# Patient Record
Sex: Female | Born: 1995 | Race: Black or African American | Hispanic: No | Marital: Single | Smoking: Current some day smoker
Health system: Southern US, Community
[De-identification: ages and names within clinical notes are randomized; demographics above are authoritative.]

## PROBLEM LIST (undated history)

## (undated) DIAGNOSIS — IMO0001 Reserved for inherently not codable concepts without codable children: Secondary | ICD-10-CM

## (undated) DIAGNOSIS — O039 Complete or unspecified spontaneous abortion without complication: Secondary | ICD-10-CM

## (undated) DIAGNOSIS — R87629 Unspecified abnormal cytological findings in specimens from vagina: Secondary | ICD-10-CM

## (undated) DIAGNOSIS — Z789 Other specified health status: Secondary | ICD-10-CM

## (undated) HISTORY — PX: NO PAST SURGERIES: SHX2092

## (undated) HISTORY — DX: Reserved for inherently not codable concepts without codable children: IMO0001

## (undated) HISTORY — DX: Other specified health status: Z78.9

## (undated) HISTORY — DX: Complete or unspecified spontaneous abortion without complication: O03.9

## (undated) HISTORY — DX: Unspecified abnormal cytological findings in specimens from vagina: R87.629

---
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
## 2013-01-07 ENCOUNTER — Ambulatory Visit (INDEPENDENT_AMBULATORY_CARE_PROVIDER_SITE_OTHER): Payer: Medicaid Other | Admitting: Women's Health

## 2013-01-07 ENCOUNTER — Encounter: Payer: Self-pay | Admitting: Women's Health

## 2013-01-07 VITALS — BP 110/60 | Wt 151.8 lb

## 2013-01-07 DIAGNOSIS — O9989 Other specified diseases and conditions complicating pregnancy, childbirth and the puerperium: Secondary | ICD-10-CM

## 2013-01-07 DIAGNOSIS — O99323 Drug use complicating pregnancy, third trimester: Secondary | ICD-10-CM

## 2013-01-07 DIAGNOSIS — Z331 Pregnant state, incidental: Secondary | ICD-10-CM

## 2013-01-07 DIAGNOSIS — Z3403 Encounter for supervision of normal first pregnancy, third trimester: Secondary | ICD-10-CM

## 2013-01-07 DIAGNOSIS — Z1389 Encounter for screening for other disorder: Secondary | ICD-10-CM

## 2013-01-07 DIAGNOSIS — O99320 Drug use complicating pregnancy, unspecified trimester: Secondary | ICD-10-CM | POA: Insufficient documentation

## 2013-01-07 NOTE — Progress Notes (Signed)
Pain in lower belly, worse at night.

## 2013-01-07 NOTE — Progress Notes (Signed)
Reports good fm. Denies regular/painful uc's, lof, vb, urinary frequency, urgency, hesitancy, or dysuria.  No complaints.  Reviewed labor s/s and fetal kick counts.  GBS GC/CT collected, UDS today. Reviewed contraception options, thinking about mirena- pamphlet given. All questions answered. F/U in 1 for visit.

## 2013-01-07 NOTE — Patient Instructions (Addendum)
Braxton Hicks Contractions  Pregnancy is commonly associated with contractions of the uterus throughout the pregnancy. Towards the end of pregnancy (32 to 34 weeks), these contractions (Braxton Hicks) can develop more often and may become more forceful. This is not true labor because these contractions do not result in opening (dilatation) and thinning of the cervix. They are sometimes difficult to tell apart from true labor because these contractions can be forceful and people have different pain tolerances. You should not feel embarrassed if you go to the hospital with false labor. Sometimes, the only way to tell if you are in true labor is for your caregiver to follow the changes in the cervix.  How to tell the difference between true and false labor:  · False labor.  · The contractions of false labor are usually shorter, irregular and not as hard as those of true labor.  · They are often felt in the front of the lower abdomen and in the groin.  · They may leave with walking around or changing positions while lying down.  · They get weaker and are shorter lasting as time goes on.  · These contractions are usually irregular.  · They do not usually become progressively stronger, regular and closer together as with true labor.  · True labor.  · Contractions in true labor last 30 to 70 seconds, become very regular, usually become more intense, and increase in frequency.  · They do not go away with walking.  · The discomfort is usually felt in the top of the uterus and spreads to the lower abdomen and low back.  · True labor can be determined by your caregiver with an exam. This will show that the cervix is dilating and getting thinner.  If there are no prenatal problems or other health problems associated with the pregnancy, it is completely safe to be sent home with false labor and await the onset of true labor.  HOME CARE INSTRUCTIONS   · Keep up with your usual exercises and instructions.  · Take medications as  directed.  · Keep your regular prenatal appointment.  · Eat and drink lightly if you think you are going into labor.  · If BH contractions are making you uncomfortable:  · Change your activity position from lying down or resting to walking/walking to resting.  · Sit and rest in a tub of warm water.  · Drink 2 to 3 glasses of water. Dehydration may cause B-H contractions.  · Do slow and deep breathing several times an hour.  SEEK IMMEDIATE MEDICAL CARE IF:   · Your contractions continue to become stronger, more regular, and closer together.  · You have a gushing, burst or leaking of fluid from the vagina.  · An oral temperature above 102° F (38.9° C) develops.  · You have passage of blood-tinged mucus.  · You develop vaginal bleeding.  · You develop continuous belly (abdominal) pain.  · You have low back pain that you never had before.  · You feel the baby's head pushing down causing pelvic pressure.  · The baby is not moving as much as it used to.  Document Released: 08/07/2005 Document Revised: 10/30/2011 Document Reviewed: 01/29/2009  ExitCare® Patient Information ©2013 [employer].

## 2013-01-08 LAB — DRUG SCREEN, URINE, NO CONFIRMATION
Benzodiazepines.: NEGATIVE
Creatinine,U: 146.9 mg/dL
Opiate Screen, Urine: NEGATIVE
Phencyclidine (PCP): NEGATIVE
Propoxyphene: NEGATIVE

## 2013-01-08 LAB — GC/CHLAMYDIA PROBE AMP
CT Probe RNA: NEGATIVE
GC Probe RNA: NEGATIVE

## 2013-01-08 LAB — OXYCODONE SCREEN, UA, RFLX CONFIRM: Oxycodone Screen, Ur: NEGATIVE ng/mL

## 2013-01-09 LAB — STREP B DNA PROBE: GBSP: POSITIVE

## 2013-01-14 ENCOUNTER — Encounter: Payer: Self-pay | Admitting: Women's Health

## 2013-01-17 ENCOUNTER — Ambulatory Visit (INDEPENDENT_AMBULATORY_CARE_PROVIDER_SITE_OTHER): Payer: Medicaid Other | Admitting: Obstetrics and Gynecology

## 2013-01-17 ENCOUNTER — Encounter: Payer: Self-pay | Admitting: Obstetrics and Gynecology

## 2013-01-17 VITALS — BP 120/80 | Wt 148.6 lb

## 2013-01-17 DIAGNOSIS — L309 Dermatitis, unspecified: Secondary | ICD-10-CM | POA: Insufficient documentation

## 2013-01-17 DIAGNOSIS — Z331 Pregnant state, incidental: Secondary | ICD-10-CM

## 2013-01-17 DIAGNOSIS — Z1389 Encounter for screening for other disorder: Secondary | ICD-10-CM

## 2013-01-17 DIAGNOSIS — Z3483 Encounter for supervision of other normal pregnancy, third trimester: Secondary | ICD-10-CM

## 2013-01-17 LAB — POCT URINALYSIS DIPSTICK
Glucose, UA: NEGATIVE
Nitrite, UA: NEGATIVE
Protein, UA: NEGATIVE

## 2013-01-17 NOTE — Progress Notes (Signed)
39 wk  Pt here today for routine visit. Pt states baby moving good, having some irregular contractions, and having some pressure. Denies any other problems at this time  + mild Headache , normal pressures, negative u/a  Reflexes 1+

## 2013-01-23 ENCOUNTER — Ambulatory Visit (INDEPENDENT_AMBULATORY_CARE_PROVIDER_SITE_OTHER): Payer: Medicaid Other | Admitting: Obstetrics and Gynecology

## 2013-01-23 VITALS — BP 130/78 | Wt 150.2 lb

## 2013-01-23 DIAGNOSIS — Z331 Pregnant state, incidental: Secondary | ICD-10-CM

## 2013-01-23 DIAGNOSIS — Z3493 Encounter for supervision of normal pregnancy, unspecified, third trimester: Secondary | ICD-10-CM

## 2013-01-23 DIAGNOSIS — O2390 Unspecified genitourinary tract infection in pregnancy, unspecified trimester: Secondary | ICD-10-CM

## 2013-01-23 DIAGNOSIS — Z1389 Encounter for screening for other disorder: Secondary | ICD-10-CM

## 2013-01-23 LAB — POCT URINALYSIS DIPSTICK

## 2013-01-23 NOTE — Progress Notes (Signed)
S:states have regular contractions since yesterday with mucous discharge, white in color, -bleeding, -srom. Good FM,    P:  Will schedule IOL at 41 wk, 01/31/13. jvf

## 2013-01-26 ENCOUNTER — Encounter (HOSPITAL_COMMUNITY): Payer: Self-pay | Admitting: *Deleted

## 2013-01-26 ENCOUNTER — Inpatient Hospital Stay (HOSPITAL_COMMUNITY)
Admission: EM | Admit: 2013-01-26 | Discharge: 2013-01-28 | DRG: 774 | Disposition: A | Discharge status: Home / Self Care | Payer: Medicaid Other | Attending: Obstetrics & Gynecology | Admitting: Obstetrics & Gynecology

## 2013-01-26 ENCOUNTER — Encounter (HOSPITAL_COMMUNITY): Payer: Self-pay | Admitting: Anesthesiology

## 2013-01-26 ENCOUNTER — Inpatient Hospital Stay (HOSPITAL_COMMUNITY): Payer: Medicaid Other | Admitting: Anesthesiology

## 2013-01-26 DIAGNOSIS — Z22330 Carrier of Group B streptococcus: Secondary | ICD-10-CM

## 2013-01-26 DIAGNOSIS — O9989 Other specified diseases and conditions complicating pregnancy, childbirth and the puerperium: Secondary | ICD-10-CM

## 2013-01-26 DIAGNOSIS — Z3490 Encounter for supervision of normal pregnancy, unspecified, unspecified trimester: Secondary | ICD-10-CM

## 2013-01-26 DIAGNOSIS — O99892 Other specified diseases and conditions complicating childbirth: Secondary | ICD-10-CM | POA: Diagnosis present

## 2013-01-26 DIAGNOSIS — Z3403 Encounter for supervision of normal first pregnancy, third trimester: Secondary | ICD-10-CM

## 2013-01-26 DIAGNOSIS — R109 Unspecified abdominal pain: Secondary | ICD-10-CM

## 2013-01-26 DIAGNOSIS — O99323 Drug use complicating pregnancy, third trimester: Secondary | ICD-10-CM

## 2013-01-26 LAB — CBC
Hemoglobin: 11.1 g/dL — ABNORMAL LOW (ref 12.0–15.0)
MCH: 30.7 pg (ref 26.0–34.0)
MCV: 84.5 fL (ref 78.0–100.0)
RBC: 3.61 MIL/uL — ABNORMAL LOW (ref 3.87–5.11)

## 2013-01-26 LAB — RPR: RPR Ser Ql: NONREACTIVE

## 2013-01-26 MED ORDER — OXYTOCIN 40 UNITS IN LACTATED RINGERS INFUSION - SIMPLE MED
1.0000 m[IU]/min | INTRAVENOUS | Status: DC
  Administered 2013-01-26: 1 m[IU]/min via INTRAVENOUS
  Filled 2013-01-26: qty 1000

## 2013-01-26 MED ORDER — SODIUM CHLORIDE 0.9 % IV SOLN: 1.0000 g | Freq: Four times a day (QID) | INTRAVENOUS | Status: DC

## 2013-01-26 MED ORDER — ONDANSETRON HCL 4 MG PO TABS: 4.0000 mg | ORAL_TABLET | ORAL | Status: DC | PRN

## 2013-01-26 MED ORDER — LACTATED RINGERS IV SOLN
500.0000 mL | Freq: Once | INTRAVENOUS | Status: AC
  Administered 2013-01-26: 500 mL via INTRAVENOUS

## 2013-01-26 MED ORDER — DEXTROSE 5 % IV SOLN
2.5000 10*6.[IU] | INTRAVENOUS | Status: DC
  Administered 2013-01-26 (×2): 2.5 10*6.[IU] via INTRAVENOUS
  Filled 2013-01-26 (×5): qty 2.5

## 2013-01-26 MED ORDER — FENTANYL 2.5 MCG/ML BUPIVACAINE 1/10 % EPIDURAL INFUSION (WH - ANES)
INTRAMUSCULAR | Status: DC | PRN
  Administered 2013-01-26: 14 mL/h via EPIDURAL

## 2013-01-26 MED ORDER — GENTAMICIN SULFATE 40 MG/ML IJ SOLN
140.0000 mg | Freq: Three times a day (TID) | INTRAMUSCULAR | Status: DC
  Filled 2013-01-26: qty 3.5

## 2013-01-26 MED ORDER — PRENATAL MULTIVITAMIN CH
1.0000 | ORAL_TABLET | Freq: Every day | ORAL | Status: DC
  Administered 2013-01-27 – 2013-01-28 (×2): 1 via ORAL
  Filled 2013-01-26 (×2): qty 1

## 2013-01-26 MED ORDER — ONDANSETRON HCL 4 MG/2ML IJ SOLN: 4.0000 mg | Freq: Four times a day (QID) | INTRAMUSCULAR | Status: DC | PRN

## 2013-01-26 MED ORDER — OXYCODONE-ACETAMINOPHEN 5-325 MG PO TABS: 1.0000 | ORAL_TABLET | ORAL | Status: DC | PRN

## 2013-01-26 MED ORDER — EPHEDRINE 5 MG/ML INJ
10.0000 mg | INTRAVENOUS | Status: DC | PRN
  Filled 2013-01-26: qty 2

## 2013-01-26 MED ORDER — SODIUM CHLORIDE 0.9 % IV SOLN
2.0000 g | Freq: Four times a day (QID) | INTRAVENOUS | Status: DC
  Administered 2013-01-26: 2 g via INTRAVENOUS
  Filled 2013-01-26 (×2): qty 2000

## 2013-01-26 MED ORDER — LIDOCAINE HCL (PF) 1 % IJ SOLN
30.0000 mL | INTRAMUSCULAR | Status: DC | PRN
  Filled 2013-01-26 (×2): qty 30

## 2013-01-26 MED ORDER — OXYTOCIN BOLUS FROM INFUSION: 500.0000 mL | INTRAVENOUS | Status: DC

## 2013-01-26 MED ORDER — ONDANSETRON HCL 4 MG/2ML IJ SOLN: 4.0000 mg | INTRAMUSCULAR | Status: DC | PRN

## 2013-01-26 MED ORDER — PHENYLEPHRINE 40 MCG/ML (10ML) SYRINGE FOR IV PUSH (FOR BLOOD PRESSURE SUPPORT)
PREFILLED_SYRINGE | INTRAVENOUS | Status: AC
  Filled 2013-01-26: qty 5

## 2013-01-26 MED ORDER — FENTANYL 2.5 MCG/ML BUPIVACAINE 1/10 % EPIDURAL INFUSION (WH - ANES)
INTRAMUSCULAR | Status: AC
  Filled 2013-01-26: qty 125

## 2013-01-26 MED ORDER — PENICILLIN G POTASSIUM 5000000 UNITS IJ SOLR
5.0000 10*6.[IU] | Freq: Once | INTRAMUSCULAR | Status: AC
  Administered 2013-01-26: 5 10*6.[IU] via INTRAVENOUS
  Filled 2013-01-26: qty 5

## 2013-01-26 MED ORDER — IBUPROFEN 600 MG PO TABS: 600.0000 mg | ORAL_TABLET | Freq: Four times a day (QID) | ORAL | Status: DC | PRN

## 2013-01-26 MED ORDER — ZOLPIDEM TARTRATE 5 MG PO TABS: 5.0000 mg | ORAL_TABLET | Freq: Every evening | ORAL | Status: DC | PRN

## 2013-01-26 MED ORDER — IBUPROFEN 600 MG PO TABS
600.0000 mg | ORAL_TABLET | Freq: Four times a day (QID) | ORAL | Status: DC
  Administered 2013-01-26 – 2013-01-28 (×7): 600 mg via ORAL
  Filled 2013-01-26 (×7): qty 1

## 2013-01-26 MED ORDER — TERBUTALINE SULFATE 1 MG/ML IJ SOLN
0.2500 mg | Freq: Once | INTRAMUSCULAR | Status: AC
  Administered 2013-01-26: 0.25 mg via SUBCUTANEOUS
  Filled 2013-01-26: qty 1

## 2013-01-26 MED ORDER — SIMETHICONE 80 MG PO CHEW: 80.0000 mg | CHEWABLE_TABLET | ORAL | Status: DC | PRN

## 2013-01-26 MED ORDER — FENTANYL CITRATE 0.05 MG/ML IJ SOLN
INTRAMUSCULAR | Status: AC
  Filled 2013-01-26: qty 2

## 2013-01-26 MED ORDER — SODIUM CHLORIDE 0.9 % IV BOLUS (SEPSIS)
500.0000 mL | Freq: Once | INTRAVENOUS | Status: AC
  Administered 2013-01-26: 500 mL via INTRAVENOUS

## 2013-01-26 MED ORDER — ACETAMINOPHEN 325 MG PO TABS: 650.0000 mg | ORAL_TABLET | ORAL | Status: DC | PRN

## 2013-01-26 MED ORDER — FENTANYL CITRATE 0.05 MG/ML IJ SOLN: 100.0000 ug | INTRAMUSCULAR | Status: DC | PRN

## 2013-01-26 MED ORDER — LACTATED RINGERS IV SOLN
INTRAVENOUS | Status: DC
  Administered 2013-01-26: 07:00:00 via INTRAVENOUS

## 2013-01-26 MED ORDER — TETANUS-DIPHTH-ACELL PERTUSSIS 5-2.5-18.5 LF-MCG/0.5 IM SUSP: 0.5000 mL | Freq: Once | INTRAMUSCULAR | Status: DC

## 2013-01-26 MED ORDER — DIBUCAINE 1 % RE OINT: 1.0000 "application " | TOPICAL_OINTMENT | RECTAL | Status: DC | PRN

## 2013-01-26 MED ORDER — SENNOSIDES-DOCUSATE SODIUM 8.6-50 MG PO TABS
2.0000 | ORAL_TABLET | Freq: Every day | ORAL | Status: DC
Start: 2013-01-26 — End: 2013-01-28
  Administered 2013-01-27 (×2): 2 via ORAL

## 2013-01-26 MED ORDER — BENZOCAINE-MENTHOL 20-0.5 % EX AERO
1.0000 "application " | INHALATION_SPRAY | CUTANEOUS | Status: DC | PRN
  Administered 2013-01-26: 1 via TOPICAL
  Filled 2013-01-26: qty 56

## 2013-01-26 MED ORDER — LIDOCAINE HCL (PF) 1 % IJ SOLN
INTRAMUSCULAR | Status: DC | PRN
  Administered 2013-01-26 (×2): 8 mL

## 2013-01-26 MED ORDER — DIPHENHYDRAMINE HCL 50 MG/ML IJ SOLN: 12.5000 mg | INTRAMUSCULAR | Status: DC | PRN

## 2013-01-26 MED ORDER — PHENYLEPHRINE 40 MCG/ML (10ML) SYRINGE FOR IV PUSH (FOR BLOOD PRESSURE SUPPORT)
80.0000 ug | PREFILLED_SYRINGE | INTRAVENOUS | Status: DC | PRN
  Filled 2013-01-26: qty 2

## 2013-01-26 MED ORDER — LACTATED RINGERS IV SOLN
500.0000 mL | INTRAVENOUS | Status: DC | PRN
  Administered 2013-01-26: 500 mL via INTRAVENOUS

## 2013-01-26 MED ORDER — EPHEDRINE 5 MG/ML INJ
INTRAVENOUS | Status: AC
  Filled 2013-01-26: qty 4

## 2013-01-26 MED ORDER — OXYTOCIN 40 UNITS IN LACTATED RINGERS INFUSION - SIMPLE MED
62.5000 mL/h | INTRAVENOUS | Status: DC
  Filled 2013-01-26: qty 1000

## 2013-01-26 MED ORDER — TERBUTALINE SULFATE 1 MG/ML IJ SOLN: 0.2500 mg | Freq: Once | INTRAMUSCULAR | Status: DC | PRN

## 2013-01-26 MED ORDER — FLEET ENEMA 7-19 GM/118ML RE ENEM: 1.0000 | ENEMA | RECTAL | Status: DC | PRN

## 2013-01-26 MED ORDER — FENTANYL CITRATE 0.05 MG/ML IJ SOLN
100.0000 ug | INTRAMUSCULAR | Status: DC | PRN
  Administered 2013-01-26 (×2): 100 ug via INTRAVENOUS
  Filled 2013-01-26: qty 2

## 2013-01-26 MED ORDER — GENTAMICIN SULFATE 40 MG/ML IJ SOLN
150.0000 mg | Freq: Once | INTRAVENOUS | Status: AC
  Administered 2013-01-26: 150 mg via INTRAVENOUS
  Filled 2013-01-26: qty 3.75

## 2013-01-26 MED ORDER — LANOLIN HYDROUS EX OINT: TOPICAL_OINTMENT | CUTANEOUS | Status: DC | PRN

## 2013-01-26 MED ORDER — FENTANYL 2.5 MCG/ML BUPIVACAINE 1/10 % EPIDURAL INFUSION (WH - ANES)
14.0000 mL/h | INTRAMUSCULAR | Status: DC | PRN
  Administered 2013-01-26: 14 mL/h via EPIDURAL
  Filled 2013-01-26: qty 125

## 2013-01-26 MED ORDER — CITRIC ACID-SODIUM CITRATE 334-500 MG/5ML PO SOLN
30.0000 mL | ORAL | Status: DC | PRN
  Filled 2013-01-26: qty 15

## 2013-01-26 MED ORDER — WITCH HAZEL-GLYCERIN EX PADS: 1.0000 "application " | MEDICATED_PAD | CUTANEOUS | Status: DC | PRN

## 2013-01-26 MED ORDER — DIPHENHYDRAMINE HCL 25 MG PO CAPS: 25.0000 mg | ORAL_CAPSULE | Freq: Four times a day (QID) | ORAL | Status: DC | PRN

## 2013-01-26 NOTE — Anesthesia Procedure Notes (Signed)
Epidural Patient location during procedure: OB Start time: 01/26/2013 10:43 AM End time: 01/26/2013 10:47 AM  Staffing Anesthesiologist: Sandrea Hughs Performed by: anesthesiologist   Preanesthetic Checklist Completed: patient identified, site marked, surgical consent, pre-op evaluation, timeout performed, IV checked, risks and benefits discussed and monitors and equipment checked  Epidural Patient position: sitting Prep: site prepped and draped and DuraPrep Patient monitoring: continuous pulse ox and blood pressure Approach: midline Injection technique: LOR air  Needle:  Needle type: Tuohy  Needle gauge: 17 G Needle length: 9 cm and 9 Needle insertion depth: 5 cm cm Catheter type: closed end flexible Catheter size: 19 Gauge Catheter at skin depth: 10 cm Test dose: negative and Other  Assessment Sensory level: T9 Events: blood not aspirated, injection not painful, no injection resistance, negative IV test and no paresthesia  Additional Notes Reason for block:procedure for pain

## 2013-01-26 NOTE — Progress Notes (Signed)
Edmonia Gonser is a 17 y.o. G1P0 at [redacted]w[redacted]d for SROM.    Subjective: Patient remains comfortable although called by nursing that FHR increasing.    Objective: BP 147/75   Pulse 99   Temp(Src) 99 F (37.2 C) (Axillary)   Resp 20   Ht 5\' 3"  (1.6 m)   Wt 68.04 kg (150 lb)   BMI 26.58 kg/m2   SpO2 98%   LMP 04/20/2012      FHT:  FHR: 170 bpm, variability: moderate,  accelerations:  Present,  decelerations:  Absent UC:   regular, every 1-2 minutes SVE:   Dilation: 9 Effacement (%): 90 Station: 0 Exam by:: dr. Lenn Sink  Labs: Lab Results  Component Value Date   WBC 6.9 01/26/2013   HGB 11.1* 01/26/2013   HCT 30.5* 01/26/2013   MCV 84.5 01/26/2013   PLT 178 01/26/2013    Assessment / Plan: Protracted active phase of labor  FHR tachycardiac.  Attending notified who evaluated patient and IUPC placed due to protracted labor.  Will start antibiotics presumptively for fetal tachycardia and maternal low grade temp.   Labor: Protracted active phase of labor Preeclampsia:  None Fetal Wellbeing:  Category II Pain Control:  Epidural I/D:  GBS positive.  Starting antibiotics for fetal tachycardia and maternal low grade temp. Anticipated MOD:  NSVD  Harvie Heck 01/26/2013, 4:57 PM

## 2013-01-26 NOTE — Progress Notes (Signed)
Jasmine Bailey is a 17 y.o. G1P0 at [redacted]w[redacted]d admitted for SROM.  Subjective: Patient comfortable with epidural in place.    Objective: BP 127/67   Pulse 77   Temp(Src) 97.9 F (36.6 C) (Oral)   Resp 20   Ht 5\' 3"  (1.6 m)   Wt 68.04 kg (150 lb)   BMI 26.58 kg/m2   SpO2 98%   LMP 04/20/2012      FHT:  FHR: 145 bpm, variability: moderate,  accelerations:  Present,  decelerations:  Absent UC:   regular, every 1-2 minutes SVE:   Dilation: 8.5 Effacement (%): 100 Station: 0 Exam by:: Jessee Avers, MD  Labs: Lab Results  Component Value Date   WBC 6.9 01/26/2013   HGB 11.1* 01/26/2013   HCT 30.5* 01/26/2013   MCV 84.5 01/26/2013   PLT 178 01/26/2013    Assessment / Plan: Spontaneous labor, progressing normally  Labor: Progressing normally Preeclampsia:  none Fetal Wellbeing:  Category I Pain Control:  Epidural I/D:  GBS positive, Pencillin Anticipated MOD:  NSVD  Holly Stegall 01/26/2013, 12:40 PM

## 2013-01-26 NOTE — Anesthesia Preprocedure Evaluation (Signed)

## 2013-01-26 NOTE — Progress Notes (Signed)
Jasmine Bailey is a 17 y.o. G1P0 at [redacted]w[redacted]d admitted following SROM.  Subjective: Patient's pain well-controlled with epidural.  Fetus remains tachycardiac.  Objective: BP 133/80   Pulse 99   Temp(Src) 98.4 F (36.9 C) (Axillary)   Resp 20   Ht 5\' 3"  (1.6 m)   Wt 68.04 kg (150 lb)   BMI 26.58 kg/m2   SpO2 98%   LMP 04/20/2012      FHT:  FHR: 165 bpm, variability: moderate,  accelerations:  Present,  decelerations:  Present one variable  UC:   regular, every 1-3 minutes  SVE:   Dilation: Lip/rim Effacement (%): 90 Station: +1;+2 Exam by:: dr. Dolan Amen  Labs: Lab Results  Component Value Date   WBC 6.9 01/26/2013   HGB 11.1* 01/26/2013   HCT 30.5* 01/26/2013   MCV 84.5 01/26/2013   PLT 178 01/26/2013    Assessment / Plan: Spontaneous labor, progressing normally  Labor: Progressing normally on pitocin Preeclampsia:  None Fetal Wellbeing:  Category II Pain Control:  Epidural I/D:  GBS positive.  On amp/gent given fetal tachycardia and maternal low grade fever. Anticipated MOD:  NSVD  Harvie Heck 01/26/2013, 6:57 PM

## 2013-01-26 NOTE — Progress Notes (Signed)
Called to [facility] to request fluid bolus and repositioning to a lateral position.

## 2013-01-26 NOTE — Progress Notes (Signed)
ANTIBIOTIC CONSULT NOTE - INITIAL  Pharmacy Consult for: Gentamicin Indication: r/o Chorioamnionitis  No Known Allergies  Patient Measurements: Height: 5\' 3"  (160 cm) Weight: 150 lb (68.04 kg) IBW/kg (Calculated) : 52.4 Adjusted Body Weight: 57.1 kg  Vital Signs: Temp: 99 F (37.2 C) (06/08 1648) Temp src: Axillary (06/08 1648) BP: 129/77 mmHg (06/08 1731) Pulse Rate: 87 (06/08 1731)    CrCl is unknown because no creatinine reading has been taken. No results found for this basename: VANCOTROUGH, VANCOPEAK, VANCORANDOM, GENTTROUGH, GENTPEAK, GENTRANDOM, TOBRATROUGH, TOBRAPEAK, TOBRARND, AMIKACINPEAK, AMIKACINTROU, AMIKACIN,  in the last 72 hours     Medical History: Past Medical History  Diagnosis Date  . Medical history non-contributory     Medications: Ampicillin 2gm IV Q 6 hours  Assessment: tx for low grade maternal temp, r/o Chorioamnionitis   Goal of Therapy: Peak ~ 6-7mg /L, Trough < 1mg /L    Plan:    1. Loading dose Gentamicin 150 mg IV at 1900 today .   2. Maintenance Gent 140mg  IV Q8 hours to begin 01/27/13 @ 0300.   3. Draw SCr if continued after delivery.   4. Monitor & draw levels as appropriate.   Hovey-Rankin, Sora Vrooman 01/26/2013,6:04 PM

## 2013-01-26 NOTE — Plan of Care (Signed)
Problem: Consults Goal: Birthing Suites Patient Information Press F2 to bring up selections list   Pt > [redacted] weeks EGA     

## 2013-01-26 NOTE — H&P (Signed)
Jasmine Bailey is a 17 y.o. G1P0 female at [redacted]w[redacted]d by 18.4wk u/s, presenting after transfer from [facility] for SROM and UCs that began at 0430.  SVE at [facility] 3cm, received 1 dose of terbutaline.  Initiated pnc @ [facility] @ 18.4wks, genetic screening normal, anatomy u/s normal female, 2hr gtt normal: 64/80/63, gbs pos. Plans to breastfeed and mirena for contraception. THC use in early pregnancy, UDS on 5/20 neg.  Maternal Medical History:  Reason for admission: Rupture of membranes and contractions.   Contractions: Onset was 3-5 hours ago.   Frequency: regular.   Perceived severity is moderate.    Fetal activity: Perceived fetal activity is normal.   Last perceived fetal movement was within the past hour.    Prenatal complications: Substance abuse (thc early pregnancy).   Prenatal Complications - Diabetes: none.    OB History   Grav Para Term Preterm Abortions TAB SAB Ect Mult Living   1              Past Medical History  Diagnosis Date  . Medical history non-contributory    Past Surgical History  Procedure Laterality Date  . No past surgeries     Family History: family history includes Diabetes in her other and Hypertension in her other. Social History:  reports that she quit smoking about 5 months ago. Her smoking use included Cigarettes. She smoked 0.50 packs per day. She has never used smokeless tobacco. She reports that she does not drink alcohol or use illicit drugs. Review of Systems  Constitutional: Negative.   HENT: Negative.   Eyes: Negative.   Respiratory: Negative.   Cardiovascular: Negative.   Gastrointestinal: Negative.   Genitourinary: Negative.   Musculoskeletal: Negative.   Skin: Negative.   Neurological: Negative.   Endo/Heme/Allergies: Negative.   Psychiatric/Behavioral: Negative.    Blood pressure 139/90, pulse 101, temperature 98 F (36.7 C), temperature source Oral, resp. rate 18, height 5\' 3"  (1.6 m), weight 68.04 kg (150 lb), last menstrual period 04/20/2012,  SpO2 98.00%. SVE: 5/90/-1 by myself at 0712 Grossly ruptured, clear fluid  Maternal Exam:  Uterine Assessment: Contraction strength is moderate.  Contraction frequency is regular.   Abdomen: Patient reports no abdominal tenderness. Fetal presentation: vertex  Introitus: Normal vulva. Normal vagina.  Amniotic fluid character: clear.  Pelvis: adequate for delivery.   Cervix: Cervix evaluated by digital exam.     Fetal Exam Fetal Monitor Review: Mode: ultrasound.   Baseline rate: 155.  Variability: moderate (6-25 bpm).   Pattern: accelerations present and no decelerations.    Fetal State Assessment: Category I - tracings are normal.     Physical Exam  Constitutional: She is oriented to person, place, and time. She appears well-developed and well-nourished.  HENT:  Head: Normocephalic.  Eyes: Pupils are equal, round, and reactive to light.  Neck: Normal range of motion.  Cardiovascular: Normal rate and regular rhythm.   Respiratory: Effort normal and breath sounds normal.  GI: Soft.  gravid  Genitourinary: Vagina normal and uterus normal.  Musculoskeletal: Normal range of motion.  Neurological: She is alert and oriented to person, place, and time. She has normal reflexes.  Skin: Skin is warm and dry.  Psychiatric: She has a normal mood and affect. Her behavior is normal. Judgment and thought content normal.    Prenatal labs: ABO, Rh: O/Positive/-- (01/07 0000) Antibody: Negative (01/07 0000) Rubella: Immune (01/07 0000) RPR:   neg HBsAg: Negative (01/07 0000)  HIV:   neg GBS: POSITIVE (05/20 1624)  Assessment/Plan: A:  [redacted]w[redacted]d [facility]  G1P0   SROM w/ labor  Cat I FHR  GBS pos  H/O THC use in early pregnancy   P:  Admit to BS  PCN per protocol for gbs pos  IV pain meds, epidural prn  Expectant management  Anticipate NSVD  Marge Duncans 01/26/2013, 7:05 AM

## 2013-01-26 NOTE — ED Notes (Signed)
Pt states water broke tonight & her contractions are about 6 minutes apart,

## 2013-01-26 NOTE — ED Provider Notes (Signed)
History     CSN: [id number]  Arrival date & time 01/26/13  0531   First MD Initiated Contact with Patient 01/26/13 [phone]      Chief Complaint  Patient presents with  . Laboring     Patient is a 17 y.o. female presenting with abdominal pain. The history is provided by the patient.  Abdominal Pain This is a new problem. The current episode started 1 to 2 hours ago. The problem occurs hourly. The problem has been gradually worsening. Associated symptoms include abdominal pain. Pertinent negatives include no chest pain and no headaches. Nothing aggravates the symptoms. Nothing relieves the symptoms. She has tried rest for the symptoms. The treatment provided no relief.  pt reports she is over [redacted] weeks pregnant She reports her "water broke" over an hour ago and she is having contractions every 6 minutes Denies vaginal bleeding No falls No fever/vomiting No dysuria She has had no complications thus far in this pregnancy This is her first pregnancy   Past Medical History  Diagnosis Date  . Medical history non-contributory     Past Surgical History  Procedure Laterality Date  . No past surgeries      Family History  Problem Relation Age of Onset  . Hypertension Other   . Diabetes Other     History  Substance Use Topics  . Smoking status: Former Smoker -- 0.50 packs/day    Types: Cigarettes    Quit date: 08/27/2012  . Smokeless tobacco: Never Used  . Alcohol Use: No    OB History   Grav Para Term Preterm Abortions TAB SAB Ect Mult Living   1               Review of Systems  Constitutional: Negative for fever.  Cardiovascular: Negative for chest pain.  Gastrointestinal: Positive for abdominal pain.  Genitourinary: Positive for vaginal discharge. Negative for vaginal bleeding.  Neurological: Negative for dizziness and headaches.  Psychiatric/Behavioral: Negative for agitation.  All other systems reviewed and are negative.    Allergies  Review of patient's  allergies indicates no known allergies.  Home Medications   Current Outpatient Rx  Name  Route  Sig  Dispense  Refill  . hydrocortisone butyrate (LUCOID) 0.1 % CREA cream   Topical   Apply 1 application topically as needed.         . prenatal vitamin w/FE, FA (PRENATAL 1 + 1) 27-1 MG TABS   Oral   Take 1 tablet by mouth daily.          BP 129/86  Pulse 81  Temp(Src) 98 F (36.7 C) (Oral)  Resp 20  Ht 5\' 3"  (1.6 m)  Wt 150 lb (68.04 kg)  BMI 26.58 kg/m2  SpO2 98%  LMP 04/20/2012   Physical Exam CONSTITUTIONAL: Well developed/well nourished, anxious HEAD: Normocephalic/atraumatic EYES: EOMI/PERRL ENMT: Mucous membranes moist NECK: supple no meningeal signs SPINE:entire spine nontender CV: S1/S2 noted, no murmurs/rubs/gallops noted LUNGS: Lungs are clear to auscultation bilaterally, no apparent distress ABDOMEN: soft, gravid but nontender, no rebound or guarding GU:no cva tenderness No vaginal bleeding. Clear vaginal fluid noted  Cervix approximately 2-3cm dilated.  Chaperone present Sterile glove used NEURO: Pt is awake/alert, moves all extremitiesx4 No clonus No hyperreflexia noted to LE EXTREMITIES: pulses normal, full ROM SKIN: warm, color normal PSYCH: no abnormalities of mood noted   ED Course  Procedures 5:41 AM Pt seen on arrival She is stable She is having frequent contractions She has been placed on  fetal monitoring I have placed call to OBGYN 5:54 AM Spoke to dr Debroah Loop and we discussed her physical exam findings He recommends terbutaline 0.25mg  [place] once He [facility] Repeat BP is 129/86 - she denies HA/dizziness or visual changes 6:01 AM Pt stable FHT appropriate Continuous monitoring  Recheck of cervix shows no increased dilation. No crowning noted Pt appears more comfortable   MDM  Nursing notes including past medical history and social history reviewed and considered in  documentation         Joya Gaskins, MD 01/26/13 [phone]

## 2013-01-26 NOTE — Progress Notes (Signed)
Call received from [facility] ED. Patient 108w2d with SROM @0430 . Placed on monitor by ED staff and called [facility] to evaluate.

## 2013-01-26 NOTE — Progress Notes (Signed)
Neela Zecca is a 17 y.o. G1P0 at [redacted]w[redacted]d admitted for SROM.  Subjective: Patient denies any pain.  Epidural in place.  Patient with N/V.  Objective: BP 128/80   Pulse 96   Temp(Src) 98.6 F (37 C) (Oral)   Resp 18   Ht 5\' 3"  (1.6 m)   Wt 68.04 kg (150 lb)   BMI 26.58 kg/m2   SpO2 98%   LMP 04/20/2012      FHT:  FHR: 150 bpm, variability: moderate,  accelerations:  Present,  decelerations:  Absent UC:   Difficult to interpret SVE:   9/90/0 Labs: Lab Results  Component Value Date   WBC 6.9 01/26/2013   HGB 11.1* 01/26/2013   HCT 30.5* 01/26/2013   MCV 84.5 01/26/2013   PLT 178 01/26/2013    Assessment / Plan: Spontaneous labor, progressing normally  Labor: Progressing normally Preeclampsia:  None Fetal Wellbeing:  Category I Pain Control:  Epidural I/D:  GBS positive Anticipated MOD:  NSVD  Holly Stegall 01/26/2013, 2:45 PM

## 2013-01-27 ENCOUNTER — Encounter (HOSPITAL_COMMUNITY): Payer: Self-pay | Admitting: *Deleted

## 2013-01-27 LAB — CBC
HCT: 26 % — ABNORMAL LOW (ref 36.0–46.0)
Hemoglobin: 9.3 g/dL — ABNORMAL LOW (ref 12.0–15.0)
MCH: 29.9 pg (ref 26.0–34.0)
MCHC: 35.8 g/dL (ref 30.0–36.0)
RDW: 13.4 % (ref 11.5–15.5)

## 2013-01-27 NOTE — Clinical Social Work Maternal (Signed)
    Clinical Social Work Department PSYCHOSOCIAL ASSESSMENT - MATERNAL/CHILD 01/27/2013  Patient:  Jasmine Bailey, Jasmine Bailey  Account Number:  [id number]  Admit Date:  01/26/2013  Marjo Bicker Name:   Susa Griffins    Clinical Social Worker:  Nobie Putnam, LCSW   Date/Time:  01/27/2013 10:49 AM  Date Referred:  01/27/2013   Referral source  CN     Referred reason  Substance Abuse   Other referral source:    I:  FAMILY / HOME ENVIRONMENT Child's legal guardian:  PARENT  Guardian - Name Guardian - Age Guardian - Address  [place] [address] [place], [place] [address]  Roselyn Bering 26    Other household support members/support persons Name Relationship DOB  Jaynie Bream MOTHER   Orlando Finelli FATHER    SISTER 31 years old   Other support:    II  PSYCHOSOCIAL DATA Information Source:  Patient Interview  Surveyor, quantity and Community Resources Employment:   Scientist, research (medical) resources:  Medicaid If Medicaid - County:  [employer] [employer] Other  Sales executive  WIC   School / Grade:   Maternity Care Coordinator / Child Services Coordination / Early Interventions:  Cultural issues impacting care:    III  STRENGTHS Strengths  Adequate Resources  Home prepared for Child (including basic supplies)  Supportive family/friends   Strength comment:    IV  RISK FACTORS AND CURRENT PROBLEMS Current Problem:  YES   Risk Factor & Current Problem Patient Issue Family Issue Risk Factor / Current Problem Comment  Substance Abuse Y N Hx of MJ use    V  SOCIAL WORK ASSESSMENT CSW referral received to assess pt's history of MJ use.  Pt admits to smoking MJ at least "4 times a week" prior to pregnancy confirmation at 6 weeks.  Once pregnancy was confirmed, she continued to smoke but decreased frequency of use.  She stopped smoking around her 5th or 6th month of pregnancy.  She denies other illegal substance use & verbalized understanding of hospital drug testing policy. UDS is negative,  meconium results are pending.  Pt has all the necessary supplies for the infant & good family support.  CSW will continue to monitor drug screen results & make a referral if necessary.      VI SOCIAL WORK PLAN Social Work Plan  No Further Intervention Required / No Barriers to Discharge   Type of pt/family education:   If child protective services report - county:   If child protective services report - date:   Information/referral to community resources comment:   Other social work plan:

## 2013-01-27 NOTE — Anesthesia Postprocedure Evaluation (Signed)
Anesthesia Post Note  Patient: Jasmine Bailey  Procedure(s) Performed: * No procedures listed *  Anesthesia type: Epidural  Patient location: Mother/Baby  Post pain: Pain level controlled  Post assessment: Post-op Vital signs reviewed  Last Vitals:  Filed Vitals:   01/27/13 0431  BP: 110/56  Pulse: 60  Temp: 36.6 C  Resp: 18    Post vital signs: Reviewed  Level of consciousness: awake  Complications: No apparent anesthesia complications

## 2013-01-27 NOTE — Progress Notes (Signed)
Post Partum Day 1 Subjective: no complaints Pt reports some abdominal pain controlled by Ibuprofen. Reports minimal vaginal bleeding. Expresses some concern with walking around. Plans to bottle feed exclusively. Plans for Mirena as BC.   Objective: Blood pressure 110/56, pulse 60, temperature 97.8 F (36.6 C), temperature source Oral, resp. rate 18, height 5\' 3"  (1.6 m), weight 68.04 kg (150 lb), last menstrual period 04/20/2012, SpO2 98.00%, unknown if currently breastfeeding.  Physical Exam:  General: alert, cooperative and no distress Lochia: appropriate Uterine Fundus: firm Incision: NA DVT Evaluation: No evidence of DVT seen on physical exam. Negative Homan's sign. No cords or calf tenderness. No significant calf/ankle edema.   Recent Labs  01/26/13 0810 01/27/13 0605  HGB 11.1* 9.3*  HCT 30.5* 26.0*    Assessment/Plan: Plan for discharge tomorrow   LOS: 1 day   Jasmine Bailey 01/27/2013, 7:55 AM

## 2013-01-27 NOTE — Progress Notes (Signed)
UR chart review completed.  

## 2013-01-27 NOTE — Discharge Summary (Deleted)
Obstetric Discharge Summary Reason for Admission: onset of labor Prenatal Procedures: none Intrapartum Procedures: spontaneous vaginal delivery Postpartum Procedures: none Complications-Operative and Postpartum: none Hemoglobin  Date Value Range Status  01/27/2013 9.3* 12.0 - 15.0 g/dL Final     HCT  Date Value Range Status  01/27/2013 26.0* 36.0 - 46.0 % Final   Pt reports minimal vaginal bleeding and abdominal cramping. Planning to bottle feed exclusively.  Plans for Mirena insertion for [facility].  Physical Exam:  General: alert, cooperative and no distress Lochia: appropriate Uterine Fundus: firm Incision: healing well DVT Evaluation: No evidence of DVT seen on physical exam. Negative Homan's sign. No cords or calf tenderness. No significant calf/ankle edema.  Discharge Diagnoses: Term Pregnancy-delivered  Discharge Information: Date: 01/27/2013 Activity: unrestricted Diet: routine Medications: PNV and Ibuprofen Condition: stable Instructions: refer to practice specific booklet Discharge to: home Follow-up Information   Follow up with [facility] In 6 weeks. (For postpartum appointment, Mirena insertion)    Contact information:   [address] [place] [address] [phone]      Newborn Data: Live born female  Birth Weight: 8 lb 0.2 oz (3635 g) APGAR: 6, 9  Home with mother.  Jasmine Bailey 01/27/2013, 7:39 AM

## 2013-01-27 NOTE — Progress Notes (Signed)
I was present for the exam and agree with above.  [place], CNM 01/27/2013 8:10 AM

## 2013-01-28 MED ORDER — IBUPROFEN 600 MG PO TABS: 600.0000 mg | ORAL_TABLET | Freq: Four times a day (QID) | ORAL | Status: DC

## 2013-01-28 MED ORDER — SENNOSIDES-DOCUSATE SODIUM 8.6-50 MG PO TABS: 2.0000 | ORAL_TABLET | Freq: Every day | ORAL | Status: DC

## 2013-01-28 NOTE — Discharge Summary (Signed)
Obstetric Discharge Summary Reason for Admission: onset of labor Prenatal Procedures: ultrasound Intrapartum Procedures: spontaneous vaginal delivery and GBS prophylaxis Postpartum Procedures: antibiotics Complications-Operative and Postpartum: none Hemoglobin  Date Value Range Status  01/27/2013 9.3* 12.0 - 15.0 g/dL Final     HCT  Date Value Range Status  01/27/2013 26.0* 36.0 - 46.0 % Final    Physical Exam:  General: alert and cooperative Lochia: appropriate Uterine Fundus: firm Incision: N/A DVT Evaluation: No evidence of DVT seen on physical exam. No cords or calf tenderness. No significant calf/ankle edema.  Discharge Diagnoses: Term Pregnancy-delivered  Discharge Information: Date: 01/28/2013 Activity: pelvic rest Diet: routine Medications: None Condition: stable Instructions: refer to practice specific booklet Discharge to: home Follow-up Information   Follow up with [facility] In 6 weeks. (For postpartum appointment, Mirena insertion)    Contact information:   [address] [place] [address] [phone]      Newborn Data: Live born female  Birth Weight: 8 lb 0.2 oz (3635 g) APGAR: 6, 9  Home with mother.  Arther Abbott 01/28/2013, 7:17 AM  I saw and examined patient and agree with above student note. I reviewed history, delivery summary, labs and vitals. Napoleon Form, MD

## 2013-01-31 ENCOUNTER — Encounter: Payer: Medicaid Other | Admitting: Obstetrics and Gynecology

## 2013-01-31 NOTE — Discharge Summary (Signed)
Attestation of Attending Supervision of Obstetric Fellow: Evaluation and management procedures were performed by the Obstetric Fellow under my supervision and collaboration.  I have reviewed the Obstetric Fellow's note and chart, and I agree with the management and plan.  Adeel Guiffre, MD, FACOG Attending Obstetrician & Gynecologist Faculty Practice, [facility]   

## 2013-02-25 ENCOUNTER — Encounter: Payer: Self-pay | Admitting: Women's Health

## 2013-02-25 ENCOUNTER — Ambulatory Visit (INDEPENDENT_AMBULATORY_CARE_PROVIDER_SITE_OTHER): Payer: Medicaid Other | Admitting: Women's Health

## 2013-02-25 VITALS — BP 130/72 | Ht 63.0 in | Wt 121.6 lb

## 2013-02-25 DIAGNOSIS — IMO0002 Reserved for concepts with insufficient information to code with codable children: Secondary | ICD-10-CM

## 2013-02-25 DIAGNOSIS — Z8759 Personal history of other complications of pregnancy, childbirth and the puerperium: Secondary | ICD-10-CM | POA: Insufficient documentation

## 2013-02-25 DIAGNOSIS — Z3400 Encounter for supervision of normal first pregnancy, unspecified trimester: Secondary | ICD-10-CM

## 2013-02-25 DIAGNOSIS — Z3009 Encounter for other general counseling and advice on contraception: Secondary | ICD-10-CM

## 2013-02-25 MED ORDER — ETONOGESTREL-ETHINYL ESTRADIOL 0.12-0.015 MG/24HR VA RING: VAGINAL_RING | VAGINAL | Status: DC

## 2013-02-25 NOTE — Patient Instructions (Signed)

## 2013-02-25 NOTE — Progress Notes (Signed)
Subjective:    Jasmine Bailey is a 17 y.o. G54P1001 African American female who presents for a postpartum visit. She is 4 weeks postpartum following a spontaneous vaginal delivery w/ brief shoulder dystocia resolved by mcrobert's and suprapubic pressure. I have fully reviewed the prenatal and intrapartum course. The delivery was at 40.2 gestational weeks. Outcome: spontaneous vaginal delivery. Anesthesia: epidural. Postpartum course has been uneventful. Baby's course has been uneventful. Baby is feeding by bottle. Bleeding no bleeding. Bowel function is normal. Bladder function is normal. Patient is sexually active. Contraception method is condoms. Requests nuva ring, discussed r/b and proper use. Postpartum depression screening: negative.  The following portions of the patient's history were reviewed and updated as appropriate: allergies, current medications, past medical history, past surgical history and problem list.  Review of Systems Pertinent items are noted in HPI.   Filed Vitals:   02/25/13 1449  BP: 130/72  Height: 5\' 3"  (1.6 m)  Weight: 121 lb 9.6 oz (55.157 kg)    Objective:     General:  alert, cooperative and no distress   Breasts:  deferred, no complaints  Lungs: clear to auscultation bilaterally  Heart:  regular rate and rhythm  Abdomen: soft, nontender   Vulva: normal  Vagina: normal vagina  Cervix:  closed  Corpus: Well-involuted  Adnexa:  Non-palpable  Rectal Exam: no hemorrhoids        Assessment:   Normal postpartum exam 4 wks s/p SVD Depression screening Contraception counseling   Plan:   Contraception: NuvaRing vaginal inserts, 1 sample given, e-scribed rx for nuva ring w/ 12RF Note to return to work per pt's request Follow up as needed.

## 2013-06-19 ENCOUNTER — Ambulatory Visit: Payer: Medicaid Other | Admitting: Obstetrics & Gynecology

## 2013-06-23 ENCOUNTER — Encounter (HOSPITAL_COMMUNITY): Payer: Self-pay | Admitting: Emergency Medicine

## 2013-06-23 ENCOUNTER — Observation Stay (HOSPITAL_COMMUNITY)
Admission: EM | Admit: 2013-06-23 | Discharge: 2013-06-24 | Disposition: A | Discharge status: Home / Self Care | Payer: BC Managed Care – PPO | Attending: General Surgery | Admitting: General Surgery

## 2013-06-23 ENCOUNTER — Emergency Department (HOSPITAL_COMMUNITY): Payer: BC Managed Care – PPO

## 2013-06-23 DIAGNOSIS — S8000XA Contusion of unspecified knee, initial encounter: Secondary | ICD-10-CM | POA: Insufficient documentation

## 2013-06-23 DIAGNOSIS — S36113A Laceration of liver, unspecified degree, initial encounter: Secondary | ICD-10-CM

## 2013-06-23 DIAGNOSIS — IMO0002 Reserved for concepts with insufficient information to code with codable children: Secondary | ICD-10-CM | POA: Insufficient documentation

## 2013-06-23 DIAGNOSIS — I499 Cardiac arrhythmia, unspecified: Secondary | ICD-10-CM | POA: Insufficient documentation

## 2013-06-23 DIAGNOSIS — R110 Nausea: Secondary | ICD-10-CM | POA: Insufficient documentation

## 2013-06-23 DIAGNOSIS — J939 Pneumothorax, unspecified: Secondary | ICD-10-CM

## 2013-06-23 DIAGNOSIS — M25569 Pain in unspecified knee: Secondary | ICD-10-CM | POA: Insufficient documentation

## 2013-06-23 DIAGNOSIS — S36112A Contusion of liver, initial encounter: Secondary | ICD-10-CM

## 2013-06-23 DIAGNOSIS — J9383 Other pneumothorax: Secondary | ICD-10-CM | POA: Insufficient documentation

## 2013-06-23 DIAGNOSIS — M542 Cervicalgia: Secondary | ICD-10-CM | POA: Insufficient documentation

## 2013-06-23 DIAGNOSIS — R079 Chest pain, unspecified: Secondary | ICD-10-CM | POA: Insufficient documentation

## 2013-06-23 DIAGNOSIS — R109 Unspecified abdominal pain: Secondary | ICD-10-CM | POA: Insufficient documentation

## 2013-06-23 DIAGNOSIS — S36114A Minor laceration of liver, initial encounter: Principal | ICD-10-CM | POA: Insufficient documentation

## 2013-06-23 DIAGNOSIS — D649 Anemia, unspecified: Secondary | ICD-10-CM | POA: Insufficient documentation

## 2013-06-23 DIAGNOSIS — F17200 Nicotine dependence, unspecified, uncomplicated: Secondary | ICD-10-CM | POA: Insufficient documentation

## 2013-06-23 DIAGNOSIS — S270XXA Traumatic pneumothorax, initial encounter: Secondary | ICD-10-CM

## 2013-06-23 LAB — COMPREHENSIVE METABOLIC PANEL
ALT: 41 U/L — ABNORMAL HIGH (ref 0–35)
AST: 67 U/L — ABNORMAL HIGH (ref 0–37)
Albumin: 4.5 g/dL (ref 3.5–5.2)
Calcium: 9.6 mg/dL (ref 8.4–10.5)
GFR calc Af Amer: >90 mL/min (ref >90)
Glucose, Bld: 90 mg/dL (ref 70–99)
Sodium: 140 mEq/L (ref 135–145)
Total Protein: 7.7 g/dL (ref 6.0–8.3)

## 2013-06-23 LAB — CBC WITH DIFFERENTIAL/PLATELET
Basophils Absolute: 0 10*3/uL (ref 0.0–0.1)
Basophils Relative: 0 % (ref 0–1)
Eosinophils Absolute: 0 10*3/uL (ref 0.0–0.7)
Eosinophils Relative: 0 % (ref 0–5)
MCH: 30.6 pg (ref 26.0–34.0)
MCHC: 35.7 g/dL (ref 30.0–36.0)
MCV: 85.9 fL (ref 78.0–100.0)
Neutrophils Relative %: 86 % — ABNORMAL HIGH (ref 43–77)
Platelets: 335 10*3/uL (ref 150–400)
RDW: 14.3 % (ref 11.5–15.5)

## 2013-06-23 LAB — URINALYSIS, ROUTINE W REFLEX MICROSCOPIC
Hgb urine dipstick: NEGATIVE
Specific Gravity, Urine: 1.015 (ref 1.005–1.030)
Urobilinogen, UA: 0.2 mg/dL (ref 0.0–1.0)
pH: 7 (ref 5.0–8.0)

## 2013-06-23 LAB — POCT PREGNANCY, URINE: Preg Test, Ur: NEGATIVE

## 2013-06-23 MED ORDER — SODIUM CHLORIDE 0.9 % IV BOLUS (SEPSIS)
1000.0000 mL | Freq: Once | INTRAVENOUS | Status: AC
  Administered 2013-06-23: 1000 mL via INTRAVENOUS

## 2013-06-23 MED ORDER — ONDANSETRON HCL 4 MG/2ML IJ SOLN
4.0000 mg | Freq: Once | INTRAMUSCULAR | Status: AC
  Administered 2013-06-23: 4 mg via INTRAVENOUS
  Filled 2013-06-23: qty 2

## 2013-06-23 MED ORDER — MORPHINE SULFATE 4 MG/ML IJ SOLN
4.0000 mg | Freq: Once | INTRAMUSCULAR | Status: AC
  Administered 2013-06-23: 4 mg via INTRAVENOUS
  Filled 2013-06-23: qty 1

## 2013-06-23 MED ORDER — IOHEXOL 350 MG/ML SOLN
100.0000 mL | Freq: Once | INTRAVENOUS | Status: AC | PRN
  Administered 2013-06-23: 100 mL via INTRAVENOUS

## 2013-06-23 NOTE — ED Notes (Signed)
Pt reports pain to her mid sternal chest 6/10.  Pt denies any other pain or discomfort.
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
## 2013-06-23 NOTE — ED Notes (Signed)
MD made aware that pt is complaining of nausea and pain. Awaiting orders

## 2013-06-23 NOTE — ED Notes (Signed)
Pt resting on stretcher. Rates pain 5/10 and is experiencing nausea.
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
## 2013-06-23 NOTE — ED Notes (Addendum)
Pt out with [facility]. No signs/symptoms of distress noted at this time. Breathing even and nonlabored.

## 2013-06-24 ENCOUNTER — Ambulatory Visit: Payer: Medicaid Other | Admitting: Obstetrics & Gynecology

## 2013-06-24 ENCOUNTER — Observation Stay (HOSPITAL_COMMUNITY): Payer: BC Managed Care – PPO

## 2013-06-24 ENCOUNTER — Encounter (HOSPITAL_COMMUNITY): Payer: Self-pay | Admitting: *Deleted

## 2013-06-24 DIAGNOSIS — S36113A Laceration of liver, unspecified degree, initial encounter: Secondary | ICD-10-CM

## 2013-06-24 DIAGNOSIS — D62 Acute posthemorrhagic anemia: Secondary | ICD-10-CM

## 2013-06-24 LAB — CBC
HCT: 31.4 % — ABNORMAL LOW (ref 36.0–46.0)
Hemoglobin: 11 g/dL — ABNORMAL LOW (ref 12.0–15.0)
Hemoglobin: 11.4 g/dL — ABNORMAL LOW (ref 12.0–15.0)
MCH: 30.8 pg (ref 26.0–34.0)
MCHC: 36.3 g/dL — ABNORMAL HIGH (ref 30.0–36.0)
MCV: 84.9 fL (ref 78.0–100.0)
Platelets: 264 10*3/uL (ref 150–400)
RBC: 3.58 MIL/uL — ABNORMAL LOW (ref 3.87–5.11)
RBC: 3.7 MIL/uL — ABNORMAL LOW (ref 3.87–5.11)
WBC: 8.9 10*3/uL (ref 4.0–10.5)
WBC: 6.3 10*3/uL (ref 4.0–10.5)

## 2013-06-24 LAB — BASIC METABOLIC PANEL
CO2: 24 mEq/L (ref 19–32)
Chloride: 100 mEq/L (ref 96–112)
Glucose, Bld: 102 mg/dL — ABNORMAL HIGH (ref 70–99)
Sodium: 133 mEq/L — ABNORMAL LOW (ref 135–145)

## 2013-06-24 LAB — MRSA PCR SCREENING: MRSA by PCR: NEGATIVE

## 2013-06-24 MED ORDER — ONDANSETRON HCL 4 MG PO TABS: 4.0000 mg | ORAL_TABLET | Freq: Four times a day (QID) | ORAL | Status: DC | PRN

## 2013-06-24 MED ORDER — ACETAMINOPHEN 325 MG PO TABS: 650.0000 mg | ORAL_TABLET | ORAL | Status: DC | PRN

## 2013-06-24 MED ORDER — ONDANSETRON HCL 4 MG/2ML IJ SOLN: 4.0000 mg | Freq: Four times a day (QID) | INTRAMUSCULAR | Status: DC | PRN

## 2013-06-24 MED ORDER — SODIUM CHLORIDE 0.9 % IV SOLN
INTRAVENOUS | Status: DC
  Administered 2013-06-24: 01:00:00 via INTRAVENOUS

## 2013-06-24 MED ORDER — OXYCODONE HCL 5 MG PO TABS
5.0000 mg | ORAL_TABLET | ORAL | Status: DC | PRN
  Administered 2013-06-24: 5 mg via ORAL
  Filled 2013-06-24: qty 1

## 2013-06-24 MED ORDER — OXYCODONE HCL 5 MG PO TABS: 5.0000 mg | ORAL_TABLET | ORAL | Status: DC | PRN

## 2013-06-24 MED ORDER — OXYCODONE-ACETAMINOPHEN 5-325 MG PO TABS: 1.0000 | ORAL_TABLET | ORAL | Status: DC | PRN

## 2013-06-24 MED ORDER — OXYCODONE HCL 5 MG PO TABS
10.0000 mg | ORAL_TABLET | ORAL | Status: DC | PRN
  Administered 2013-06-24: 10 mg via ORAL
  Filled 2013-06-24: qty 2

## 2013-06-24 MED ORDER — HYDROMORPHONE HCL PF 1 MG/ML IJ SOLN: 0.5000 mg | INTRAMUSCULAR | Status: DC | PRN

## 2013-06-24 NOTE — Plan of Care (Signed)
Problem: Phase I Progression Outcomes Goal: Pain controlled with appropriate interventions Outcome: Progressing Denies pain at this time, does have pain with movement Goal: OOB as tolerated unless otherwise ordered Outcome: Not Progressing Pt o is on strict bedrest Goal: Hemodynamically stable Outcome: Progressing Progressing VSS

## 2013-06-24 NOTE — Progress Notes (Addendum)
Pt to TX to 6N-10, VSS, called report.
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
## 2013-06-24 NOTE — Progress Notes (Signed)
Having some pain but pain medicine helping. Check F/U Hb. Mobilize. Patient examined and I agree with the assessment and plan  Violeta Gelinas, MD, MPH, FACS Pager: [phone]  06/24/2013 12:55 PM
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
## 2013-06-24 NOTE — Progress Notes (Signed)
Discharge instructions reviewed with patient. Questions answered re: plan for follow-up and new prescription. Printed AVS and prescription given to patient. Patient discharged to home. Accompanied by mother.

## 2013-06-24 NOTE — Discharge Summary (Signed)
Kaylor Simenson, MD, MPH, FACS Pager: [phone]  

## 2013-06-26 ENCOUNTER — Other Ambulatory Visit: Payer: Self-pay

## 2013-07-22 ENCOUNTER — Encounter: Payer: Self-pay | Admitting: Obstetrics & Gynecology

## 2013-07-22 ENCOUNTER — Other Ambulatory Visit (HOSPITAL_COMMUNITY)
Admission: RE | Admit: 2013-07-22 | Discharge: 2013-07-22 | Disposition: A | Discharge status: Home / Self Care | Payer: BC Managed Care – PPO | Source: Ambulatory Visit | Attending: Obstetrics & Gynecology | Admitting: Obstetrics & Gynecology

## 2013-07-22 ENCOUNTER — Ambulatory Visit (INDEPENDENT_AMBULATORY_CARE_PROVIDER_SITE_OTHER): Payer: Medicaid Other | Admitting: Obstetrics & Gynecology

## 2013-07-22 VITALS — BP 110/70 | Ht 61.0 in | Wt 121.0 lb

## 2013-07-22 DIAGNOSIS — Z01419 Encounter for gynecological examination (general) (routine) without abnormal findings: Secondary | ICD-10-CM | POA: Insufficient documentation

## 2013-07-22 DIAGNOSIS — Z3049 Encounter for surveillance of other contraceptives: Secondary | ICD-10-CM

## 2013-07-22 NOTE — Progress Notes (Signed)
Patient ID: Jasmine Bailey, female   DOB: Aug 07, 1996, 17 y.o.   MRN: [id number] Subjective:     Jasmine Bailey is a 17 y.o. female here for a routine exam.  Patient's last menstrual period was 07/20/2013. G1P1001 Birth Control Method:  None Menstrual Calendar(currently): regular  Current complaints: none.   Current acute medical issues:  none   Recent Gynecologic History Patient's last menstrual period was 07/20/2013. Last Pap: na,  na Last mammogram: na,  na  Past Medical History  Diagnosis Date  . Medical history non-contributory     Past Surgical History  Procedure Laterality Date  . No past surgeries      OB History   Grav Para Term Preterm Abortions TAB SAB Ect Mult Living   1 1 1       1       History   Social History  . Marital Status: Single    Spouse Name: N/A    Number of Children: N/A  . Years of Education: N/A   Social History Main Topics  . Smoking status: Current Every Day Smoker -- 0.50 packs/day    Types: Cigarettes    Last Attempt to Quit: 08/27/2012  . Smokeless tobacco: Never Used  . Alcohol Use: No  . Drug Use: No     Comment: HX of THC, quit with positive preg. test.   . Sexual Activity: Yes    Birth Control/ Protection: Condom   Other Topics Concern  . None   Social History Narrative  . None    Family History  Problem Relation Age of Onset  . Hypertension Other   . Diabetes Other      Review of Systems  Review of Systems  Constitutional: Negative for fever, chills, weight loss, malaise/fatigue and diaphoresis.  HENT: Negative for hearing loss, ear pain, nosebleeds, congestion, sore throat, neck pain, tinnitus and ear discharge.   Eyes: Negative for blurred vision, double vision, photophobia, pain, discharge and redness.  Respiratory: Negative for cough, hemoptysis, sputum production, shortness of breath, wheezing and stridor.   Cardiovascular: Negative for chest pain, palpitations, orthopnea, claudication, leg swelling and PND.   Gastrointestinal: negative for abdominal pain. Negative for heartburn, nausea, vomiting, diarrhea, constipation, blood in stool and melena.  Genitourinary: Negative for dysuria, urgency, frequency, hematuria and flank pain.  Musculoskeletal: Negative for myalgias, back pain, joint pain and falls.  Skin: Negative for itching and rash.  Neurological: Negative for dizziness, tingling, tremors, sensory change, speech change, focal weakness, seizures, loss of consciousness, weakness and headaches.  Endo/Heme/Allergies: Negative for environmental allergies and polydipsia. Does not bruise/bleed easily.  Psychiatric/Behavioral: Negative for depression, suicidal ideas, hallucinations, memory loss and substance abuse. The patient is not nervous/anxious and does not have insomnia.        Objective:    Physical Exam  Vitals reviewed. Constitutional: She is oriented to person, place, and time. She appears well-developed and well-nourished.  HENT:  Head: Normocephalic and atraumatic.        Right Ear: External ear normal.  Left Ear: External ear normal.  Nose: Nose normal.  Mouth/Throat: Oropharynx is clear and moist.  Eyes: Conjunctivae and EOM are normal. Pupils are equal, round, and reactive to light. Right eye exhibits no discharge. Left eye exhibits no discharge. No scleral icterus.  Neck: Normal range of motion. Neck supple. No tracheal deviation present. No thyromegaly present.  Cardiovascular: Normal rate, regular rhythm, normal heart sounds and intact distal pulses.  Exam reveals no gallop and no friction rub.  No murmur heard. Respiratory: Effort normal and breath sounds normal. No respiratory distress. She has no wheezes. She has no rales. She exhibits no tenderness.  GI: Soft. Bowel sounds are normal. She exhibits no distension and no mass. There is no tenderness. There is no rebound and no guarding.  Genitourinary:  Breasts no masses skin changes or nipple changes bilaterally       Vulva is normal without lesions Vagina is pink moist without discharge Cervix normal in appearance and pap is done Uterus is normal size shape and contour Adnexa is negative with normal sized ovaries   Musculoskeletal: Normal range of motion. She exhibits no edema and no tenderness.  Neurological: She is alert and oriented to person, place, and time. She has normal reflexes. She displays normal reflexes. No cranial nerve deficit. She exhibits normal muscle tone. Coordination normal.  Skin: Skin is warm and dry. No rash noted. No erythema. No pallor.  Psychiatric: She has a normal mood and affect. Her behavior is normal. Judgment and thought content normal.       Assessment:    Healthy female exam.    Plan:    Follow up in: when on menses for nexplanon on menses.

## 2013-07-22 NOTE — Addendum Note (Signed)
Addended by: Criss Alvine on: 07/22/2013 04:24 PM   Modules accepted: Orders

## 2014-02-08 ENCOUNTER — Emergency Department (HOSPITAL_COMMUNITY)
Admission: EM | Admit: 2014-02-08 | Discharge: 2014-02-08 | Disposition: A | Discharge status: Home / Self Care | Payer: BC Managed Care – PPO | Attending: Emergency Medicine | Admitting: Emergency Medicine

## 2014-02-08 ENCOUNTER — Encounter (HOSPITAL_COMMUNITY): Payer: Self-pay | Admitting: Emergency Medicine

## 2014-02-08 DIAGNOSIS — F17200 Nicotine dependence, unspecified, uncomplicated: Secondary | ICD-10-CM | POA: Insufficient documentation

## 2014-02-08 DIAGNOSIS — N9089 Other specified noninflammatory disorders of vulva and perineum: Secondary | ICD-10-CM

## 2014-02-08 DIAGNOSIS — A6000 Herpesviral infection of urogenital system, unspecified: Secondary | ICD-10-CM | POA: Insufficient documentation

## 2014-02-08 DIAGNOSIS — Z792 Long term (current) use of antibiotics: Secondary | ICD-10-CM | POA: Insufficient documentation

## 2014-02-08 DIAGNOSIS — Z3202 Encounter for pregnancy test, result negative: Secondary | ICD-10-CM | POA: Insufficient documentation

## 2014-02-08 LAB — WET PREP, GENITAL
Trich, Wet Prep: NONE SEEN
Yeast Wet Prep HPF POC: NONE SEEN

## 2014-02-08 LAB — HIV ANTIBODY (ROUTINE TESTING W REFLEX): HIV 1&2 Ab, 4th Generation: NONREACTIVE

## 2014-02-08 LAB — POC URINE PREG, ED: Preg Test, Ur: NEGATIVE

## 2014-02-08 LAB — RPR

## 2014-02-08 MED ORDER — VALACYCLOVIR HCL 1 G PO TABS: 1000.0000 mg | ORAL_TABLET | Freq: Two times a day (BID) | ORAL | Status: AC

## 2014-02-08 MED ORDER — HYDROCODONE-ACETAMINOPHEN 5-325 MG PO TABS: 1.0000 | ORAL_TABLET | ORAL | Status: DC | PRN

## 2014-02-08 NOTE — ED Provider Notes (Signed)
CSN: [id number]     Arrival date & time 02/08/14  1403 History   First MD Initiated Contact with Patient 02/08/14 1407     Chief Complaint  Patient presents with  . Vaginal Pain   Patient is a 18 y.o. female presenting with vaginal discharge. The history is provided by the patient. No language interpreter was used.  Vaginal Discharge Quality:  Watery and yellow Onset quality:  Gradual Duration:  1 week Timing:  Constant Progression:  Worsening Chronicity:  New Relieved by:  Nothing Worsened by:  Intercourse and position Ineffective treatments:  None tried Associated symptoms: fever and nausea   Associated symptoms: no dysuria and no vomiting    This chart was scribed for nurse practitioner working with Donnetta HutchingBrian Cook, MD, by Andrew Auaven Small, ED Scribe. This patient was seen in room APFT20/APFT20 and the patient's care was started at 2:09 PM.  Jasmine KitchensKeyona Bailey is a 18 y.o. female who presents to the Emergency Department complaining of vaginal pain which started last week with associated subjective fever, chills, HA and mild nausea. She describes pain as burning to the outside of the vaginal area with blisters and ulcerations. She rates the pain 6/10 and only feels pain with walking and certain movements. Pt is not on birth control. Pt has recently taken a PH balance pill prescribed for her mother. Pt has recently had unprotected intercourse and last had intercourse last night. Her current partner is new and has known him for a month. States she has received oral sex from this partner. Pt has recently had a pap smear. She reports she has been pregnant once and that her daughter is a year old. Pt last menstrual cycle was june 10th but can possibly be pregnant.  Pt denies similar symptoms in the past.  She denies emesis, body aches, sore throat, and cough. She denies dsyuria and fever.  Past Medical History  Diagnosis Date  . Medical history non-contributory    Past Surgical History  Procedure  Laterality Date  . No past surgeries     Family History  Problem Relation Age of Onset  . Hypertension Other   . Diabetes Other    History  Substance Use Topics  . Smoking status: Current Every Day Smoker -- 0.50 packs/day    Types: Cigarettes    Last Attempt to Quit: 08/27/2012  . Smokeless tobacco: Never Used  . Alcohol Use: No   OB History   Grav Para Term Preterm Abortions TAB SAB Ect Mult Living   1 1 1       1      Review of Systems  Constitutional: Positive for fever and chills.  HENT: Negative for sore throat.   Eyes: Negative for redness and itching.  Respiratory: Negative for cough.   Gastrointestinal: Positive for nausea. Negative for vomiting, diarrhea and constipation.  Genitourinary: Positive for vaginal discharge and vaginal pain. Negative for dysuria, urgency, frequency, decreased urine volume and difficulty urinating.  Skin: Positive for rash.  Neurological: Positive for headaches.  Psychiatric/Behavioral: Negative for confusion. The patient is not nervous/anxious.     Allergies  Review of patient's allergies indicates no known allergies.  Home Medications   Prior to Admission medications   Medication Sig Start Date End Date Taking? Authorizing Provider  hydrocortisone butyrate (LUCOID) 0.1 % CREA cream Apply 1 application topically daily as needed.  11/04/12   Historical Provider, MD  ibuprofen (ADVIL,MOTRIN) 200 MG tablet Take 200 mg by mouth every 6 (six) hours as needed for  pain.    Historical Provider, MD  oxyCODONE-acetaminophen (ROXICET) 5-325 MG per tablet Take 1-2 tablets by mouth every 4 (four) hours as needed. 06/24/13   Freeman CaldronMichael J. Jeffery, PA-C   BP 118/77  Pulse 90  Temp(Src) 98.3 F (36.8 C) (Oral)  Resp 16  LMP 01/28/2014 Physical Exam  Nursing note and vitals reviewed. Constitutional: She is oriented to person, place, and time. She appears well-developed and well-nourished. No distress.  HENT:  Head: Normocephalic and atraumatic.   Eyes: Conjunctivae and EOM are normal. Pupils are equal, round, and reactive to light.  Neck: Normal range of motion. Neck supple.  Cardiovascular: Normal rate, regular rhythm and normal heart sounds.   Pulmonary/Chest: Effort normal and breath sounds normal.  Abdominal: Soft. There is no tenderness.  Genitourinary: Uterus normal. There is lesion on the right labia. There is lesion on the left labia. Uterus is not enlarged. Cervix exhibits motion tenderness and discharge. Right adnexum displays no mass and no tenderness. Left adnexum displays no mass and no tenderness.  BB sized nodes in left inguinal area none on the right She has multiple vesicular lesions to bilateral labia. Lesion were tender on exam  Erythema of the vaginal mucosa  Watery discharge in the vaginal vault  No cervical motion tenderness   Musculoskeletal: Normal range of motion.  Neurological: She is alert and oriented to person, place, and time.  Skin: Skin is warm and dry.  Psychiatric: She has a normal mood and affect. Her behavior is normal.    ED Course  Procedures (including critical care time)  2:39 PM-Discussed treatment plan. Cultures were collected for chlamydia, herpes and GC. Labs for HIV and syphilis.  Results for orders placed during the hospital encounter of 02/08/14 (from the past 24 hour(s))  WET PREP, GENITAL     Status: Abnormal   Collection Time    02/08/14  2:37 PM      Result Value Ref Range   Yeast Wet Prep HPF POC NONE SEEN  NONE SEEN   Trich, Wet Prep NONE SEEN  NONE SEEN   Clue Cells Wet Prep HPF POC FEW (*) NONE SEEN   WBC, Wet Prep HPF POC FEW (*) NONE SEEN  POC URINE PREG, ED     Status: None   Collection Time    02/08/14  2:47 PM      Result Value Ref Range   Preg Test, Ur NEGATIVE  NEGATIVE     MDM  18 y.o. female with vaginal pain and lesions that started about a week ago. Will treat for HSV while cultures pending. She will return for any problems.  Discussed with the patient  and all questioned fully answered.    Medication List    TAKE these medications       HYDROcodone-acetaminophen 5-325 MG per tablet  Commonly known as:  NORCO/VICODIN  Take 1 tablet by mouth every 4 (four) hours as needed.     valACYclovir 1000 MG tablet  Commonly known as:  VALTREX  Take 1 tablet (1,000 mg total) by mouth 2 (two) times daily.      ASK your doctor about these medications       hydrocortisone cream 1 %  Apply 1 application topically daily as needed (Eczema).         I personally performed the services described in this documentation, which was scribed in my presence. The recorded information has been reviewed and is accurate.      Hope Orlene OchM Neese, NP  02/08/14 1533 

## 2014-02-08 NOTE — ED Notes (Signed)
Pt verbalized understanding of no driving and to use caution within 4 hours of taking vicodin due to med causes drowsiness

## 2014-02-08 NOTE — ED Notes (Signed)
Blistering and ulcerations to vaginal area. First noticed this morning. States she noticed after taking an "acid pill to help balance pH."

## 2014-02-08 NOTE — Discharge Instructions (Signed)
Your cultures will not be back for a few days. We will only call if they are positive. Take the medication as directed. Follow up with [facility]. Return here as needed.

## 2014-02-09 LAB — GC/CHLAMYDIA PROBE AMP
CT Probe RNA: NEGATIVE
GC Probe RNA: NEGATIVE

## 2014-02-11 LAB — HERPES SIMPLEX VIRUS CULTURE: CULTURE: DETECTED

## 2014-02-11 NOTE — ED Provider Notes (Signed)
Medical screening examination/treatment/procedure(s) were performed by non-physician practitioner and as supervising physician I was immediately available for consultation/collaboration.   EKG Interpretation None       Donnetta HutchingBrian Ronald Londo, MD 02/11/14 [phone]

## 2014-02-12 NOTE — ED Notes (Signed)
Post ED Visit - Positive Culture Follow-up  Culture report reviewed by antimicrobial stewardship pharmacist: []  Wes Dulaney, Pharm.D., BCPS []  Celedonio MiyamotoJeremy Frens, [address] BCPS []  Georgina PillionElizabeth Martin, Pharm.D., BCPS [x]  NegleyMinh Pham, [address] BCPS, AAHIVP []  Estella HuskMichelle Turner, Pharm.D., BCPS, AAHIVP []  Harvie JuniorNathan Cope, Pharm.D.  Positive herpes simplex culture Treated with valtrex, organism sensitive to the same and no further patient follow-up is required at this time.  Ashley JacobsFesterman, Toni C 02/12/2014, 11:22 AM
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
## 2014-06-22 ENCOUNTER — Encounter (HOSPITAL_COMMUNITY): Payer: Self-pay | Admitting: Emergency Medicine

## 2016-10-11 ENCOUNTER — Ambulatory Visit (INDEPENDENT_AMBULATORY_CARE_PROVIDER_SITE_OTHER): Payer: Medicaid Other | Admitting: Adult Health

## 2016-10-11 ENCOUNTER — Encounter: Payer: Self-pay | Admitting: Adult Health

## 2016-10-11 VITALS — BP 100/70 | HR 66 | Ht 62.0 in | Wt 123.5 lb

## 2016-10-11 DIAGNOSIS — R112 Nausea with vomiting, unspecified: Secondary | ICD-10-CM

## 2016-10-11 DIAGNOSIS — N926 Irregular menstruation, unspecified: Secondary | ICD-10-CM | POA: Diagnosis not present

## 2016-10-11 DIAGNOSIS — Z3201 Encounter for pregnancy test, result positive: Secondary | ICD-10-CM

## 2016-10-11 DIAGNOSIS — Z3A14 14 weeks gestation of pregnancy: Secondary | ICD-10-CM

## 2016-10-11 DIAGNOSIS — O3680X0 Pregnancy with inconclusive fetal viability, not applicable or unspecified: Secondary | ICD-10-CM

## 2016-10-11 LAB — POCT URINE PREGNANCY: Preg Test, Ur: POSITIVE — AB

## 2016-10-11 MED ORDER — FLINTSTONES COMPLETE 60 MG PO CHEW: 1.0000 | CHEWABLE_TABLET | Freq: Every day | ORAL | Status: DC

## 2016-10-11 NOTE — Patient Instructions (Signed)
Second Trimester of Pregnancy The second trimester is from week 13 through week 28 (months 4 through 6). The second trimester is often a time when you feel your best. Your body has also adjusted to being pregnant, and you begin to feel better physically. Usually, morning sickness has lessened or quit completely, you may have more energy, and you may have an increase in appetite. The second trimester is also a time when the fetus is growing rapidly. At the end of the sixth month, the fetus is about 9 inches long and weighs about 1 pounds. You will likely begin to feel the baby move (quickening) between 18 and 20 weeks of the pregnancy. Body changes during your second trimester Your body continues to go through many changes during your second trimester. The changes vary from woman to woman.  Your weight will continue to increase. You will notice your lower abdomen bulging out.  You may begin to get stretch marks on your hips, abdomen, and breasts.  You may develop headaches that can be relieved by medicines. The medicines should be approved by your health care provider.  You may urinate more often because the fetus is pressing on your bladder.  You may develop or continue to have heartburn as a result of your pregnancy.  You may develop constipation because certain hormones are causing the muscles that push waste through your intestines to slow down.  You may develop hemorrhoids or swollen, bulging veins (varicose veins).  You may have back pain. This is caused by:  Weight gain.  Pregnancy hormones that are relaxing the joints in your pelvis.  A shift in weight and the muscles that support your balance.  Your breasts will continue to grow and they will continue to become tender.  Your gums may bleed and may be sensitive to brushing and flossing.  Dark spots or blotches (chloasma, mask of pregnancy) may develop on your face. This will likely fade after the baby is born.  A dark line  from your belly button to the pubic area (linea nigra) may appear. This will likely fade after the baby is born.  You may have changes in your hair. These can include thickening of your hair, rapid growth, and changes in texture. Some women also have hair loss during or after pregnancy, or hair that feels dry or thin. Your hair will most likely return to normal after your baby is born. What to expect at prenatal visits During a routine prenatal visit:  You will be weighed to make sure you and the fetus are growing normally.  Your blood pressure will be taken.  Your abdomen will be measured to track your baby's growth.  The fetal heartbeat will be listened to.  Any test results from the previous visit will be discussed. Your health care provider may ask you:  How you are feeling.  If you are feeling the baby move.  If you have had any abnormal symptoms, such as leaking fluid, bleeding, severe headaches, or abdominal cramping.  If you are using any tobacco products, including cigarettes, chewing tobacco, and electronic cigarettes.  If you have any questions. Other tests that may be performed during your second trimester include:  Blood tests that check for:  Low iron levels (anemia).  Gestational diabetes (between 24 and 28 weeks).  Rh antibodies. This is to check for a protein on red blood cells (Rh factor).  Urine tests to check for infections, diabetes, or protein in the urine.  An ultrasound to   confirm the proper growth and development of the baby.  An amniocentesis to check for possible genetic problems.  Fetal screens for spina bifida and Down syndrome.  HIV (human immunodeficiency virus) testing. Routine prenatal testing includes screening for HIV, unless you choose not to have this test. Follow these instructions at home: Eating and drinking  Continue to eat regular, healthy meals.  Avoid raw meat, uncooked cheese, cat litter boxes, and soil used by cats. These  carry germs that can cause birth defects in the baby.  Take your prenatal vitamins.  Take 1500-2000 mg of calcium daily starting at the 20th week of pregnancy until you deliver your baby.  If you develop constipation:  Take over-the-counter or prescription medicines.  Drink enough fluid to keep your urine clear or pale yellow.  Eat foods that are high in fiber, such as fresh fruits and vegetables, whole grains, and beans.  Limit foods that are high in fat and processed sugars, such as fried and sweet foods. Activity  Exercise only as directed by your health care provider. Experiencing uterine cramps is a good sign to stop exercising.  Avoid heavy lifting, wear low heel shoes, and practice good posture.  Wear your seat belt at all times when driving.  Rest with your legs elevated if you have leg cramps or low back pain.  Wear a good support bra for breast tenderness.  Do not use hot tubs, steam rooms, or saunas. Lifestyle  Avoid all smoking, herbs, alcohol, and unprescribed drugs. These chemicals affect the formation and growth of the baby.  Do not use any products that contain nicotine or tobacco, such as cigarettes and e-cigarettes. If you need help quitting, ask your health care provider.  A sexual relationship may be continued unless your health care provider directs you otherwise. General instructions  Follow your health care provider's instructions regarding medicine use. There are medicines that are either safe or unsafe to take during pregnancy.  Take warm sitz baths to soothe any pain or discomfort caused by hemorrhoids. Use hemorrhoid cream if your health care provider approves.  If you develop varicose veins, wear support hose. Elevate your feet for 15 minutes, 3-4 times a day. Limit salt in your diet.  Visit your dentist if you have not gone yet during your pregnancy. Use a soft toothbrush to brush your teeth and be gentle when you floss.  Keep all follow-up  prenatal visits as told by your health care provider. This is important. Contact a health care provider if:  You have dizziness.  You have mild pelvic cramps, pelvic pressure, or nagging pain in the abdominal area.  You have persistent nausea, vomiting, or diarrhea.  You have a bad smelling vaginal discharge.  You have pain with urination. Get help right away if:  You have a fever.  You are leaking fluid from your vagina.  You have spotting or bleeding from your vagina.  You have severe abdominal cramping or pain.  You have rapid weight gain or weight loss.  You have shortness of breath with chest pain.  You notice sudden or extreme swelling of your face, hands, ankles, feet, or legs.  You have not felt your baby move in over an hour.  You have severe headaches that do not go away with medicine.  You have vision changes. Summary  The second trimester is from week 13 through week 28 (months 4 through 6). It is also a time when the fetus is growing rapidly.  Your body goes   through many changes during pregnancy. The changes vary from woman to woman.  Avoid all smoking, herbs, alcohol, and unprescribed drugs. These chemicals affect the formation and growth your baby.  Do not use any tobacco products, such as cigarettes, chewing tobacco, and e-cigarettes. If you need help quitting, ask your health care provider.  Contact your health care provider if you have any questions. Keep all prenatal visits as told by your health care provider. This is important. This information is not intended to replace advice given to you by your health care provider. Make sure you discuss any questions you have with your health care provider. Document Released: 08/01/2001 Document Revised: 01/13/2016 Document Reviewed: 10/08/2012 Elsevier Interactive Patient Education  2017 [employer]  

## 2016-10-11 NOTE — Progress Notes (Signed)
Subjective:     Patient ID: Jasmine KitchensKeyona Bailey, female   DOB: 06/30/1996, 21 y.o.   MRN: [id number]  HPI Zack SealKeyona is a 21 year old black female in for UPT, has missed several periods and has had nausea and vomiting, has not done HPT.   Review of Systems +missed periods  +nausea and vomiting Reviewed past medical,surgical, social and family history. Reviewed medications and allergies.     Objective:   Physical Exam BP 100/70 (BP Location: Right Arm, Patient Position: Sitting, Cuff Size: Normal)   Pulse 66   Ht 5\' 2"  (1.575 m)   Wt 123 lb 8 oz (56 kg)   LMP 07/05/2016 (Approximate)   BMI 22.59 kg/m UPT +, about 14 weeks by LMP with EDD 04/11/17. Skin warm and dry. Neck: mid line trachea, normal thyroid, good ROM, no lymphadenopathy noted. Lungs: clear to ausculation bilaterally. Cardiovascular: regular rate and rhythm.Abdomen is soft and non tender.US shows IUP +FM and +FHM with fetal pole that looks about 14 weeks. PHQ 2 score 0. Declines meds for N/V.    Assessment:     1. Pregnancy examination or test, positive result   2. [redacted] weeks gestation of pregnancy   3. Encounter to determine fetal viability of pregnancy, single or unspecified fetus       Plan:     Meds ordered this encounter  Medications  . flintstones complete (FLINTSTONES) 60 MG chewable tablet    Sig: Chew 1 tablet by mouth daily.    Order Specific Question:   Supervising Provider    Answer:   Lazaro ArmsEURE, LUTHER H [[id number]]  Follow up in 1 week for dating US Review handout on second trimester

## 2016-10-18 ENCOUNTER — Other Ambulatory Visit: Payer: Medicaid Other

## 2016-11-14 ENCOUNTER — Ambulatory Visit (INDEPENDENT_AMBULATORY_CARE_PROVIDER_SITE_OTHER): Payer: Medicaid Other

## 2016-11-14 ENCOUNTER — Other Ambulatory Visit: Payer: Self-pay | Admitting: Adult Health

## 2016-11-14 DIAGNOSIS — Z363 Encounter for antenatal screening for malformations: Secondary | ICD-10-CM | POA: Diagnosis not present

## 2016-11-14 DIAGNOSIS — O3680X0 Pregnancy with inconclusive fetal viability, not applicable or unspecified: Secondary | ICD-10-CM

## 2016-11-14 NOTE — Progress Notes (Addendum)
US 17+3 wks,cephalic,cx 4.4 cm,svp of fluid 4.5 cm,normal ov's bilat,EFW 199 g,ant fibroid 1.5 x 1 x .9 cm,anatomy complete,no obvious abnormalities,EDD 04/21/2017 BY [place]

## 2016-11-15 ENCOUNTER — Other Ambulatory Visit: Payer: Medicaid Other

## 2016-11-23 ENCOUNTER — Ambulatory Visit: Payer: Medicaid Other | Admitting: *Deleted

## 2016-11-23 ENCOUNTER — Encounter: Payer: Medicaid Other | Admitting: Advanced Practice Midwife

## 2017-02-14 ENCOUNTER — Ambulatory Visit: Payer: No Typology Code available for payment source | Admitting: Adult Health

## 2017-02-27 ENCOUNTER — Ambulatory Visit: Payer: No Typology Code available for payment source | Admitting: Adult Health

## 2017-03-07 ENCOUNTER — Ambulatory Visit: Payer: No Typology Code available for payment source | Admitting: Adult Health

## 2017-03-16 ENCOUNTER — Ambulatory Visit: Payer: No Typology Code available for payment source | Admitting: Adult Health

## 2017-05-29 ENCOUNTER — Ambulatory Visit: Payer: No Typology Code available for payment source | Admitting: Advanced Practice Midwife

## 2017-07-14 ENCOUNTER — Encounter (HOSPITAL_COMMUNITY): Payer: Self-pay

## 2017-10-22 ENCOUNTER — Other Ambulatory Visit: Payer: Self-pay | Admitting: Adult Health

## 2017-10-30 ENCOUNTER — Encounter (HOSPITAL_COMMUNITY): Payer: Self-pay | Admitting: *Deleted

## 2017-10-30 ENCOUNTER — Other Ambulatory Visit: Payer: Self-pay

## 2017-10-30 ENCOUNTER — Emergency Department (HOSPITAL_COMMUNITY)
Admission: EM | Admit: 2017-10-30 | Discharge: 2017-10-30 | Disposition: A | Discharge status: Home / Self Care | Payer: Medicaid Other | Attending: Emergency Medicine | Admitting: Emergency Medicine

## 2017-10-30 DIAGNOSIS — F17210 Nicotine dependence, cigarettes, uncomplicated: Secondary | ICD-10-CM | POA: Diagnosis not present

## 2017-10-30 DIAGNOSIS — J029 Acute pharyngitis, unspecified: Secondary | ICD-10-CM | POA: Insufficient documentation

## 2017-10-30 DIAGNOSIS — J028 Acute pharyngitis due to other specified organisms: Secondary | ICD-10-CM

## 2017-10-30 LAB — RAPID STREP SCREEN (MED CTR MEBANE ONLY): Streptococcus, Group A Screen (Direct): NEGATIVE

## 2017-10-30 MED ORDER — AMOXICILLIN 500 MG PO CAPS: 500.0000 mg | ORAL_CAPSULE | Freq: Three times a day (TID) | ORAL | 0 refills | Status: DC

## 2017-10-30 MED ORDER — IBUPROFEN 600 MG PO TABS: 600.0000 mg | ORAL_TABLET | Freq: Four times a day (QID) | ORAL | 0 refills | Status: DC

## 2017-10-30 NOTE — Discharge Instructions (Signed)
Your vital signs are within normal limits.  Your examination is consistent with pharyngitis or sore throat.  Please use salt water gargles, and Chloraseptic spray.  Please use Tylenol every 4 hours or ibuprofen every 6 hours for aching and pain.  Use Amoxil 3 times daily with food.  [facility] your throat or if any worsening of your condition.
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
## 2017-10-30 NOTE — ED Triage Notes (Signed)
Pt c/o sore throat x 3 days. Pt also c/o swelling to right side of neck. Unknown fever. Pt reports her grandmother gave her some antibiotics last night and she started feeling a little better.

## 2017-11-01 LAB — CULTURE, GROUP A STREP (THRC)

## 2017-11-02 ENCOUNTER — Other Ambulatory Visit: Payer: Self-pay | Admitting: Adult Health

## 2018-03-05 ENCOUNTER — Ambulatory Visit: Payer: Medicaid Other | Admitting: Advanced Practice Midwife

## 2018-05-06 ENCOUNTER — Other Ambulatory Visit: Payer: Medicaid Other | Admitting: Women's Health

## 2018-06-25 ENCOUNTER — Encounter: Payer: Self-pay | Admitting: Advanced Practice Midwife

## 2018-06-25 ENCOUNTER — Ambulatory Visit (INDEPENDENT_AMBULATORY_CARE_PROVIDER_SITE_OTHER): Payer: Medicaid Other | Admitting: Advanced Practice Midwife

## 2018-06-25 ENCOUNTER — Other Ambulatory Visit (HOSPITAL_COMMUNITY)
Admission: RE | Admit: 2018-06-25 | Discharge: 2018-06-25 | Disposition: A | Discharge status: Home / Self Care | Payer: Medicaid Other | Source: Ambulatory Visit | Attending: Advanced Practice Midwife | Admitting: Advanced Practice Midwife

## 2018-06-25 VITALS — BP 107/64 | HR 55 | Ht 62.0 in | Wt 126.8 lb

## 2018-06-25 DIAGNOSIS — Z124 Encounter for screening for malignant neoplasm of cervix: Secondary | ICD-10-CM

## 2018-06-25 DIAGNOSIS — Z0000 Encounter for general adult medical examination without abnormal findings: Secondary | ICD-10-CM

## 2018-06-25 DIAGNOSIS — Z30013 Encounter for initial prescription of injectable contraceptive: Secondary | ICD-10-CM

## 2018-06-25 MED ORDER — MEDROXYPROGESTERONE ACETATE 150 MG/ML IM SUSP: 150.0000 mg | INTRAMUSCULAR | 3 refills | Status: DC

## 2018-06-25 NOTE — Patient Instructions (Signed)

## 2018-06-25 NOTE — Progress Notes (Signed)
Jasmine Bailey 22 y.o.  Vitals:   06/25/18 1016  BP: 107/64  Pulse: (!) 55     Filed Weights   06/25/18 1016  Weight: 126 lb 12.8 oz (57.5 kg)    Past Medical History: Past Medical History:  Diagnosis Date  . Medical history non-contributory     Past Surgical History: Past Surgical History:  Procedure Laterality Date  . NO PAST SURGERIES      Family History: Family History  Problem Relation Age of Onset  . Hypertension Other   . Diabetes Other   . Congestive Heart Failure Maternal Grandfather     Social History: Social History   Tobacco Use  . Smoking status: Current Some Day Smoker    Packs/day: 0.00    Years: 5.00    Pack years: 0.00    Types: Cigarettes    Last attempt to quit: 08/27/2012    Years since quitting: 5.8  . Smokeless tobacco: Never Used  Substance Use Topics  . Alcohol use: Yes    Comment: occasionally   . 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     Allergies: No Known Allergies    Current Outpatient Medications:  .  amoxicillin (AMOXIL) 500 MG capsule, Take 1 capsule (500 mg total) by mouth 3 (three) times daily. (Patient not taking: Reported on 06/25/2018), Disp: 21 capsule, Rfl: 0 .  ibuprofen (ADVIL,MOTRIN) 600 MG tablet, Take 1 tablet (600 mg total) by mouth 4 (four) times daily. (Patient not taking: Reported on 06/25/2018), Disp: 30 tablet, Rfl: 0 .  medroxyPROGESTERone (DEPO-PROVERA) 150 MG/ML injection, Inject 1 mL (150 mg total) into the muscle every 3 (three) months., Disp: 1 mL, Rfl: 3  History of Present Illness: Here for pap and physical.  Last pap 2014, normal.  Not on BC now. Has been on NR and COCs in the past.  Debating bt NR and depo now.  . Risks/benefits/side effects of each discussed.  Pt chooses depo.  Should be getting ready to start her period in the next few days. .     Review of Systems   Patient denies any headaches, blurred vision, shortness of breath, chest pain, abdominal pain,  problems with bowel movements, urination, or intercourse.   Physical Exam: General:  Well developed, well nourished, no acute distress Skin:  Warm and dry Neck:  Midline trachea, normal thyroid Lungs; Clear to auscultation bilaterally Breast:  No dominant palpable mass, retraction, or nipple discharge Cardiovascular: Regular rate and rhythm Abdomen:  Soft, non tender, no hepatosplenomegaly Pelvic:  External genitalia is normal in appearance.  The vagina is normal in appearance.  The cervix is bulbous.  Uterus is felt to be normal size, shape, and contour.  No adnexal masses or tenderness noted.  Extremities:  No swelling or varicosities noted Psych:  No mood changes.     Impression: Normal GYN exam     Plan: If pap normal, repeat q 3 years . Depo rx sent to pharmacy. Call us when she starts her period and get it on the first/second day.

## 2018-06-27 LAB — CYTOLOGY - PAP
Chlamydia: NEGATIVE
DIAGNOSIS: NEGATIVE
Neisseria Gonorrhea: NEGATIVE

## 2018-07-12 ENCOUNTER — Telehealth: Payer: Self-pay | Admitting: *Deleted

## 2018-07-12 NOTE — Telephone Encounter (Signed)
Pt informed of normal pap smear. No further questions at this time.
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
## 2018-09-19 ENCOUNTER — Ambulatory Visit: Payer: Medicaid Other | Admitting: Adult Health

## 2018-11-06 ENCOUNTER — Other Ambulatory Visit: Payer: Self-pay | Admitting: Obstetrics & Gynecology

## 2018-11-06 DIAGNOSIS — O3680X0 Pregnancy with inconclusive fetal viability, not applicable or unspecified: Secondary | ICD-10-CM

## 2018-11-07 ENCOUNTER — Other Ambulatory Visit: Payer: Self-pay

## 2018-11-07 ENCOUNTER — Other Ambulatory Visit: Payer: Self-pay | Admitting: Obstetrics & Gynecology

## 2018-11-07 ENCOUNTER — Ambulatory Visit (INDEPENDENT_AMBULATORY_CARE_PROVIDER_SITE_OTHER): Payer: 59

## 2018-11-07 DIAGNOSIS — Z363 Encounter for antenatal screening for malformations: Secondary | ICD-10-CM | POA: Diagnosis not present

## 2018-11-07 DIAGNOSIS — O3680X0 Pregnancy with inconclusive fetal viability, not applicable or unspecified: Secondary | ICD-10-CM

## 2018-11-07 DIAGNOSIS — Z3A17 17 weeks gestation of pregnancy: Secondary | ICD-10-CM | POA: Diagnosis not present

## 2018-11-07 NOTE — Progress Notes (Signed)
[place] 17+6 wks,cephalic,posterior fundal placenta gr 0,svp 5.8 cm,cx 3.5 cm,normal ovaries bilat,fhr 146 bpm,efw 208 g,EDD 04/11/2019 by today's ultrasound

## 2018-11-25 ENCOUNTER — Encounter: Payer: Self-pay | Admitting: *Deleted

## 2018-11-26 ENCOUNTER — Other Ambulatory Visit: Payer: Self-pay

## 2018-11-26 ENCOUNTER — Ambulatory Visit: Payer: Medicaid Other | Admitting: *Deleted

## 2018-11-26 ENCOUNTER — Ambulatory Visit (INDEPENDENT_AMBULATORY_CARE_PROVIDER_SITE_OTHER): Payer: Medicaid Other | Admitting: Women's Health

## 2018-11-26 ENCOUNTER — Encounter: Payer: Self-pay | Admitting: Women's Health

## 2018-11-26 VITALS — BP 107/69 | HR 69 | Wt 133.0 lb

## 2018-11-26 DIAGNOSIS — Z331 Pregnant state, incidental: Secondary | ICD-10-CM

## 2018-11-26 DIAGNOSIS — Z3A20 20 weeks gestation of pregnancy: Secondary | ICD-10-CM

## 2018-11-26 DIAGNOSIS — Z1389 Encounter for screening for other disorder: Secondary | ICD-10-CM

## 2018-11-26 DIAGNOSIS — O0932 Supervision of pregnancy with insufficient antenatal care, second trimester: Secondary | ICD-10-CM

## 2018-11-26 DIAGNOSIS — Z3482 Encounter for supervision of other normal pregnancy, second trimester: Secondary | ICD-10-CM

## 2018-11-26 DIAGNOSIS — Z3490 Encounter for supervision of normal pregnancy, unspecified, unspecified trimester: Secondary | ICD-10-CM | POA: Insufficient documentation

## 2018-11-26 LAB — POCT URINALYSIS DIPSTICK OB
Blood, UA: NEGATIVE
Glucose, UA: NEGATIVE
Ketones, UA: NEGATIVE
Leukocytes, UA: NEGATIVE
Nitrite, UA: NEGATIVE
POC,PROTEIN,UA: NEGATIVE

## 2018-11-26 MED ORDER — CROMOLYN SODIUM 5.2 MG/ACT NA AERS: 1.0000 | INHALATION_SPRAY | Freq: Four times a day (QID) | NASAL | 6 refills | Status: DC
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
## 2018-11-26 NOTE — Patient Instructions (Signed)
Jasmine KitchensKeyona Bailey, I greatly value your feedback.  If you receive a survey following your visit with us today, we appreciate you taking the time to fill it out.  Thanks, Jasmine Bailey, CNM, [facility]  [facility]!!! It is now [facility] at [facility] ([address] [place], [place] [address]) Entrance located off of [address] Free 24/7 valet parking   Check your blood pressure once a week. If top > or = 140 or bottom > or = 90, call and let us know.   [place] Pediatricians/Family Doctors:  [facility] [phone]            [facility] [phone]                 [facility] [phone] (usually not accepting new patients unless you have family there already, you are always welcome to call and ask)       [facility] [phone]       [facility]ians/Family [facility]:   [facility]: [phone]  [facility]: [phone]  [facility]: [phone]  [facility]:   [facility]: [phone]   [facility]: [phone]  [facility]:  [facility]: [phone]    Second Trimester of Pregnancy The second trimester is from week 14 through week 27 (months 4 through 6). The second trimester is often a time when you feel your best. Your body has adjusted to being pregnant, and you begin to feel better physically. Usually, morning sickness has lessened or quit completely, you may have more energy, and you may have an increase in appetite. The second trimester is also a time when the fetus is growing rapidly. At the end of the sixth month, the fetus is about 9 inches long and weighs about 1 pounds. You will likely begin to feel the baby move (quickening) between 16 and 20 weeks of pregnancy. Body changes during your second trimester Your body continues to go through many  changes during your second trimester. The changes vary from woman to woman.  Your weight will continue to increase. You will notice your lower abdomen bulging out.  You may begin to get stretch marks on your hips, abdomen, and breasts.  You may develop headaches that can be relieved by medicines. The medicines should be approved by your health care provider.  You may urinate more often because the fetus is pressing on your bladder.  You may develop or continue to have heartburn as a result of your pregnancy.  You may develop constipation because certain hormones are causing the muscles that push waste through your intestines to slow down.  You may develop hemorrhoids or swollen, bulging veins (varicose veins).  You may have back pain. This is caused by: ? Weight gain. ? Pregnancy hormones that are relaxing the joints in your pelvis. ? A shift in weight and the muscles that support your balance.  Your breasts will continue to grow and they will continue to become tender.  Your gums may bleed and may be sensitive to brushing and flossing.  Dark spots or blotches (chloasma, mask of pregnancy) may develop on your face. This will likely fade after the baby is born.  A dark line from your belly button to the pubic area (linea nigra) may appear. This will likely fade after the baby is born.  You may have changes in your hair. These can include thickening of your hair,  rapid growth, and changes in texture. Some women also have hair loss during or after pregnancy, or hair that feels dry or thin. Your hair will most likely return to normal after your baby is born.  What to expect at prenatal visits During a routine prenatal visit:  You will be weighed to make sure you and the fetus are growing normally.  Your blood pressure will be taken.  Your abdomen will be measured to track your baby's growth.  The fetal heartbeat will be listened to.  Any test results from the previous visit will  be discussed.  Your health care provider may ask you:  How you are feeling.  If you are feeling the baby move.  If you have had any abnormal symptoms, such as leaking fluid, bleeding, severe headaches, or abdominal cramping.  If you are using any tobacco products, including cigarettes, chewing tobacco, and electronic cigarettes.  If you have any questions.  Other tests that may be performed during your second trimester include:  Blood tests that check for: ? Low iron levels (anemia). ? High blood sugar that affects pregnant women (gestational diabetes) between 22 and 28 weeks. ? Rh antibodies. This is to check for a protein on red blood cells (Rh factor).  Urine tests to check for infections, diabetes, or protein in the urine.  An ultrasound to confirm the proper growth and development of the baby.  An amniocentesis to check for possible genetic problems.  Fetal screens for spina bifida and Down syndrome.  HIV (human immunodeficiency virus) testing. Routine prenatal testing includes screening for HIV, unless you choose not to have this test.  Follow these instructions at home: Medicines  Follow your health care provider's instructions regarding medicine use. Specific medicines may be either safe or unsafe to take during pregnancy.  Take a prenatal vitamin that contains at least 600 micrograms (mcg) of folic acid.  If you develop constipation, try taking a stool softener if your health care provider approves. Eating and drinking  Eat a balanced diet that includes fresh fruits and vegetables, whole grains, good sources of protein such as meat, eggs, or tofu, and low-fat dairy. Your health care provider will help you determine the amount of weight gain that is right for you.  Avoid raw meat and uncooked cheese. These carry germs that can cause birth defects in the baby.  If you have low calcium intake from food, talk to your health care provider about whether you should take  a daily calcium supplement.  Limit foods that are high in fat and processed sugars, such as fried and sweet foods.  To prevent constipation: ? Drink enough fluid to keep your urine clear or pale yellow. ? Eat foods that are high in fiber, such as fresh fruits and vegetables, whole grains, and beans. Activity  Exercise only as directed by your health care provider. Most women can continue their usual exercise routine during pregnancy. Try to exercise for 30 minutes at least 5 days a week. Stop exercising if you experience uterine contractions.  Avoid heavy lifting, wear low heel shoes, and practice good posture.  A sexual relationship may be continued unless your health care provider directs you otherwise. Relieving pain and discomfort  Wear a good support bra to prevent discomfort from breast tenderness.  Take warm sitz baths to soothe any pain or discomfort caused by hemorrhoids. Use hemorrhoid cream if your health care provider approves.  Rest with your legs elevated if you have leg cramps or low  back pain.  If you develop varicose veins, wear support hose. Elevate your feet for 15 minutes, 3-4 times a day. Limit salt in your diet. Prenatal Care  Write down your questions. Take them to your prenatal visits.  Keep all your prenatal visits as told by your health care provider. This is important. Safety  Wear your seat belt at all times when driving.  Make a list of emergency phone numbers, including numbers for family, friends, the hospital, and police and fire departments. General instructions  Ask your health care provider for a referral to a local prenatal education class. Begin classes no later than the beginning of month 6 of your pregnancy.  Ask for help if you have counseling or nutritional needs during pregnancy. Your health care provider can offer advice or refer you to specialists for help with various needs.  Do not use hot tubs, steam rooms, or saunas.  Do not  douche or use tampons or scented sanitary pads.  Do not cross your legs for long periods of time.  Avoid cat litter boxes and soil used by cats. These carry germs that can cause birth defects in the baby and possibly loss of the fetus by miscarriage or stillbirth.  Avoid all smoking, herbs, alcohol, and unprescribed drugs. Chemicals in these products can affect the formation and growth of the baby.  Do not use any products that contain nicotine or tobacco, such as cigarettes and e-cigarettes. If you need help quitting, ask your health care provider.  Visit your dentist if you have not gone yet during your pregnancy. Use a soft toothbrush to brush your teeth and be gentle when you floss. Contact a health care provider if:  You have dizziness.  You have mild pelvic cramps, pelvic pressure, or nagging pain in the abdominal area.  You have persistent nausea, vomiting, or diarrhea.  You have a bad smelling vaginal discharge.  You have pain when you urinate. Get help right away if:  You have a fever.  You are leaking fluid from your vagina.  You have spotting or bleeding from your vagina.  You have severe abdominal cramping or pain.  You have rapid weight gain or weight loss.  You have shortness of breath with chest pain.  You notice sudden or extreme swelling of your face, hands, ankles, feet, or legs.  You have not felt your baby move in over an hour.  You have severe headaches that do not go away when you take medicine.  You have vision changes. Summary  The second trimester is from week 14 through week 27 (months 4 through 6). It is also a time when the fetus is growing rapidly.  Your body goes through many changes during pregnancy. The changes vary from woman to woman.  Avoid all smoking, herbs, alcohol, and unprescribed drugs. These chemicals affect the formation and growth your baby.  Do not use any tobacco products, such as cigarettes, chewing tobacco, and  e-cigarettes. If you need help quitting, ask your health care provider.  Contact your health care provider if you have any questions. Keep all prenatal visits as told by your health care provider. This is important. This information is not intended to replace advice given to you by your health care provider. Make sure you discuss any questions you have with your health care provider. Document Released: 08/01/2001 Document Revised: 01/13/2016 Document Reviewed: 10/08/2012 Elsevier Interactive Patient Education  2017 [employer].

## 2018-11-27 LAB — URINALYSIS, ROUTINE W REFLEX MICROSCOPIC
Bilirubin, UA: NEGATIVE
Glucose, UA: NEGATIVE
Ketones, UA: NEGATIVE
Leukocytes,UA: NEGATIVE
Nitrite, UA: NEGATIVE
Protein,UA: NEGATIVE
RBC, UA: NEGATIVE
Specific Gravity, UA: 1.024 (ref 1.005–1.030)
Urobilinogen, Ur: 1 mg/dL (ref 0.2–1.0)
pH, UA: 7.5 (ref 5.0–7.5)

## 2018-11-27 LAB — OBSTETRIC PANEL, INCLUDING HIV
Antibody Screen: NEGATIVE
Basophils Absolute: 0 10*3/uL (ref 0.0–0.2)
Basos: 0 %
EOS (ABSOLUTE): 0.2 10*3/uL (ref 0.0–0.4)
Eos: 3 %
HIV Screen 4th Generation wRfx: NONREACTIVE
Hematocrit: 27.7 % — ABNORMAL LOW (ref 34.0–46.6)
Hemoglobin: 9.6 g/dL — ABNORMAL LOW (ref 11.1–15.9)
Hepatitis B Surface Ag: NEGATIVE
Immature Grans (Abs): 0 10*3/uL (ref 0.0–0.1)
Immature Granulocytes: 0 %
Lymphocytes Absolute: 1.5 10*3/uL (ref 0.7–3.1)
Lymphs: 29 %
MCH: 31.2 pg (ref 26.6–33.0)
MCHC: 34.7 g/dL (ref 31.5–35.7)
MCV: 90 fL (ref 79–97)
Monocytes Absolute: 0.4 10*3/uL (ref 0.1–0.9)
Monocytes: 7 %
Neutrophils Absolute: 3.2 10*3/uL (ref 1.4–7.0)
Neutrophils: 61 %
Platelets: 285 10*3/uL (ref 150–450)
RBC: 3.08 x10E6/uL — ABNORMAL LOW (ref 3.77–5.28)
RDW: 13.1 % (ref 11.7–15.4)
RPR Ser Ql: NONREACTIVE
Rh Factor: POSITIVE
Rubella Antibodies, IGG: 1.51 index (ref >0.99)
WBC: 5.3 10*3/uL (ref 3.4–10.8)

## 2018-11-27 LAB — PMP SCREEN PROFILE (10S), URINE
Amphetamine Scrn, Ur: NEGATIVE ng/mL
BARBITURATE SCREEN URINE: NEGATIVE ng/mL
BENZODIAZEPINE SCREEN, URINE: NEGATIVE ng/mL
CANNABINOIDS UR QL SCN: NEGATIVE ng/mL
Cocaine (Metab) Scrn, Ur: NEGATIVE ng/mL
Creatinine(Crt), U: 104 mg/dL (ref 20.0–300.0)
Methadone Screen, Urine: NEGATIVE ng/mL
OXYCODONE+OXYMORPHONE UR QL SCN: NEGATIVE ng/mL
Opiate Scrn, Ur: NEGATIVE ng/mL
Ph of Urine: 7.8 (ref 4.5–8.9)
Phencyclidine Qn, Ur: NEGATIVE ng/mL
Propoxyphene Scrn, Ur: NEGATIVE ng/mL

## 2018-11-27 LAB — GC/CHLAMYDIA PROBE AMP
Chlamydia trachomatis, NAA: NEGATIVE
Neisseria Gonorrhoeae by PCR: NEGATIVE

## 2018-11-27 LAB — SICKLE CELL SCREEN: Sickle Cell Screen: NEGATIVE

## 2018-11-28 LAB — URINE CULTURE

## 2018-12-02 ENCOUNTER — Encounter: Payer: Self-pay | Admitting: Women's Health

## 2018-12-02 ENCOUNTER — Other Ambulatory Visit: Payer: Self-pay | Admitting: Women's Health

## 2018-12-02 DIAGNOSIS — D649 Anemia, unspecified: Secondary | ICD-10-CM | POA: Insufficient documentation

## 2018-12-02 MED ORDER — FERROUS SULFATE 325 (65 FE) MG PO TABS: 325.0000 mg | ORAL_TABLET | Freq: Two times a day (BID) | ORAL | 3 refills | Status: DC

## 2018-12-04 ENCOUNTER — Telehealth: Payer: Self-pay | Admitting: *Deleted

## 2018-12-04 NOTE — Telephone Encounter (Signed)
LMOVM to return my call  

## 2018-12-06 ENCOUNTER — Telehealth: Payer: Self-pay | Admitting: Obstetrics and Gynecology

## 2018-12-06 NOTE — Telephone Encounter (Signed)
LMOVM that I am able to see BP's.

## 2018-12-06 NOTE — Telephone Encounter (Signed)
Pt requesting a call to make sure she is logging her BP in [employer] correctly.

## 2018-12-31 ENCOUNTER — Telehealth: Payer: Self-pay | Admitting: Women's Health

## 2018-12-31 NOTE — Telephone Encounter (Signed)
Patient called, stated she took a bubble bath about a week ago and now she's irritated down there.  Stated she will be 6 months 01/09/19.  [employer]  [phone]

## 2018-12-31 NOTE — Telephone Encounter (Signed)
Patient states she is having a vaginal discharge with slight odor. "the discharge is just different".  Offer patient appt for tomorrow for eval for possible BV but patient stated she was at Dr office in [place].  Advised to call us back if she wanted to be seen here at our office.  Verbalized understanding.

## 2019-01-07 ENCOUNTER — Other Ambulatory Visit: Payer: Medicaid Other

## 2019-01-07 ENCOUNTER — Encounter: Payer: Medicaid Other | Admitting: Women's Health

## 2019-01-10 ENCOUNTER — Other Ambulatory Visit: Payer: Medicaid Other

## 2019-01-14 ENCOUNTER — Encounter: Payer: Self-pay | Admitting: *Deleted

## 2019-01-15 ENCOUNTER — Ambulatory Visit (INDEPENDENT_AMBULATORY_CARE_PROVIDER_SITE_OTHER): Payer: Medicaid Other | Admitting: Women's Health

## 2019-01-15 ENCOUNTER — Other Ambulatory Visit: Payer: Self-pay

## 2019-01-15 ENCOUNTER — Other Ambulatory Visit: Payer: Medicaid Other

## 2019-01-15 ENCOUNTER — Encounter: Payer: Self-pay | Admitting: Women's Health

## 2019-01-15 VITALS — BP 109/59 | HR 78 | Wt 146.0 lb

## 2019-01-15 DIAGNOSIS — Z1389 Encounter for screening for other disorder: Secondary | ICD-10-CM

## 2019-01-15 DIAGNOSIS — Z3A27 27 weeks gestation of pregnancy: Secondary | ICD-10-CM

## 2019-01-15 DIAGNOSIS — Z3482 Encounter for supervision of other normal pregnancy, second trimester: Secondary | ICD-10-CM

## 2019-01-15 DIAGNOSIS — Z331 Pregnant state, incidental: Secondary | ICD-10-CM

## 2019-01-15 LAB — POCT URINALYSIS DIPSTICK OB
Blood, UA: NEGATIVE
Glucose, UA: NEGATIVE
Ketones, UA: NEGATIVE
Leukocytes, UA: NEGATIVE
Nitrite, UA: NEGATIVE
POC,PROTEIN,UA: NEGATIVE

## 2019-01-15 NOTE — Progress Notes (Signed)
   LOW-RISK PREGNANCY VISIT Patient name: Jasmine Bailey MRN [id number]  Date of birth: February 08, 1996 Chief Complaint:   Routine Prenatal Visit (PN2)  History of Present Illness:   Jasmine Bailey is a 23 y.o. G44P1011 female at [redacted]w[redacted]d with an Estimated Date of Delivery: 04/11/19 being seen today for ongoing management of a low-risk pregnancy.  Today she reports feels like lower back needs to pop and it won't. Can try chiropractor. Contractions: Not present. Vag. Bleeding: None.  Movement: Present. denies leaking of fluid. Review of Systems:   Pertinent items are noted in HPI Denies abnormal vaginal discharge w/ itching/odor/irritation, headaches, visual changes, shortness of breath, chest pain, abdominal pain, severe nausea/vomiting, or problems with urination or bowel movements unless otherwise stated above. Pertinent History Reviewed:  Reviewed past medical,surgical, social, obstetrical and family history.  Reviewed problem list, medications and allergies. Physical Assessment:   Vitals:   01/15/19 1003  BP: (!) 109/59  Pulse: 78  Weight: 146 lb (66.2 kg)  Body mass index is 26.7 kg/m.        Physical Examination:   General appearance: Well appearing, and in no distress  Mental status: Alert, oriented to person, place, and time  Skin: Warm & dry  Cardiovascular: Normal heart rate noted  Respiratory: Normal respiratory effort, no distress  Abdomen: Soft, gravid, nontender  Pelvic: Cervical exam deferred         Extremities: Edema: None  Fetal Status: Fetal Heart Rate (bpm): 140 Fundal Height: 28 cm Movement: Present    Results for orders placed or performed in visit on 01/15/19 (from the past 24 hour(s))  POC Urinalysis Dipstick OB   Collection Time: 01/15/19 10:06 AM  Result Value Ref Range   Color, UA     Clarity, UA     Glucose, UA Negative Negative   Bilirubin, UA     Ketones, UA neg    Spec Grav, UA     Blood, UA neg    pH, UA     POC,PROTEIN,UA Negative Negative, Trace,  Small (1+), Moderate (2+), Large (3+), 4+   Urobilinogen, UA     Nitrite, UA neg    Leukocytes, UA Negative Negative   Appearance     Odor      Assessment & Plan:  1) Low-risk pregnancy G3P1011 at [redacted]w[redacted]d with an Estimated Date of Delivery: 04/11/19   Meds: No orders of the defined types were placed in this encounter.  Labs/procedures today: pn2, declined tdap- maybe next time  Plan:  Continue routine obstetrical care   Reviewed: Preterm labor symptoms and general obstetric precautions including but not limited to vaginal bleeding, contractions, leaking of fluid and fetal movement were reviewed in detail with the patient.  All questions were answered  Follow-up: Return in about 4 weeks (around 02/12/2019) for LROB webex.  Orders Placed This Encounter  Procedures  . POC Urinalysis Dipstick OB   Cheral Marker CNM, [facility] 01/15/2019 10:32 AM

## 2019-01-15 NOTE — Patient Instructions (Addendum)
Jasmine Bailey, I greatly value your feedback.  If you receive a survey following your visit with [place] today, we appreciate you taking the time to fill it out.  Thanks, Joellyn Haff, CNM, [facility]  [facility] HAS MOVED!!! It is now [facility] ([address] [place], [place] [address]) Entrance located off of [address] Free 24/7 valet parking   Home Blood Pressure Monitoring for Patients   Your provider has recommended that you check your blood pressure (BP) at least once a week at home. If you do not have a blood pressure cuff at home, one will be provided for you. Contact your provider if you have not received your monitor within 1 week.   Helpful Tips for Accurate Home Blood Pressure Checks  . Don't smoke, exercise, or drink caffeine 30 minutes before checking your BP . Use the restroom before checking your BP (a full bladder can raise your pressure) . Relax in a comfortable upright chair . Feet on the ground . Left arm resting comfortably on a flat surface at the level of your heart . Legs uncrossed . Back supported . Sit quietly and don't talk . Place the cuff on your bare arm . Adjust snuggly, so that only two fingertips can fit between your skin and the top of the cuff . Check 2 readings separated by at least one minute . Keep a log of your BP readings . For a visual, please reference this diagram: http://ccnc.care/bpdiagram  Provider Name: [facility]     Phone: [phone]  Zone 1: ALL CLEAR  Continue to monitor your symptoms:  . BP reading is less than 140 (top number) or less than 90 (bottom number)  . No right upper stomach pain . No headaches or seeing spots . No feeling nauseated or throwing up . No swelling in face and hands  Zone 2: CAUTION Call your doctor's office for any of the following:  . BP reading is greater than 140 (top number) or greater than 90 (bottom number)  . Stomach pain under your ribs in the middle  or right side . Headaches or seeing spots . Feeling nauseated or throwing up . Swelling in face and hands  Zone 3: EMERGENCY  Seek immediate medical care if you have any of the following:  . BP reading is greater than160 (top number) or greater than 110 (bottom number) . Severe headaches not improving with Tylenol . Serious difficulty catching your breath . Any worsening symptoms from Zone 2     Call the office [phone]) or go to [facility] if:  You begin to have strong, frequent contractions  Your water breaks.  Sometimes it is a big gush of fluid, sometimes it is just a trickle that keeps getting your panties wet or running down your legs  You have vaginal bleeding.  It is normal to have a small amount of spotting if your cervix was checked.   You don't feel your baby moving like normal.  If you don't, get you something to eat and drink and lay down and focus on feeling your baby move.  You should feel at least 10 movements in 2 hours.  If you don't, you should call the office or go to [facility].    Tdap Vaccine  It is recommended that you get the Tdap vaccine during the third trimester of EACH pregnancy to help protect your baby from getting pertussis (whooping cough)  27-36 weeks is  the BEST time to do this so that you can pass the protection on to your baby. During pregnancy is better than after pregnancy, but if you are unable to get it during pregnancy it will be offered at the hospital.   You can get this vaccine with [place], at the health department, your family doctor, or some local pharmacies  Everyone who will be around your baby should also be up-to-date on their vaccines before the baby comes. Adults (who are not pregnant) only need 1 dose of Tdap during adulthood.   Third Trimester of Pregnancy The third trimester is from week 29 through week 42, months 7 through 9. The third trimester is a time when the fetus is growing rapidly. At the end of the ninth  month, the fetus is about 20 inches in length and weighs 6-10 pounds.  BODY CHANGES Your body goes through many changes during pregnancy. The changes vary from woman to woman.   Your weight will continue to increase. You can expect to gain 25-35 pounds (11-16 kg) by the end of the pregnancy.  You may begin to get stretch marks on your hips, abdomen, and breasts.  You may urinate more often because the fetus is moving lower into your pelvis and pressing on your bladder.  You may develop or continue to have heartburn as a result of your pregnancy.  You may develop constipation because certain hormones are causing the muscles that push waste through your intestines to slow down.  You may develop hemorrhoids or swollen, bulging veins (varicose veins).  You may have pelvic pain because of the weight gain and pregnancy hormones relaxing your joints between the bones in your pelvis. Backaches may result from overexertion of the muscles supporting your posture.  You may have changes in your hair. These can include thickening of your hair, rapid growth, and changes in texture. Some women also have hair loss during or after pregnancy, or hair that feels dry or thin. Your hair will most likely return to normal after your baby is born.  Your breasts will continue to grow and be tender. A yellow discharge may leak from your breasts called colostrum.  Your belly button may stick out.  You may feel short of breath because of your expanding uterus.  You may notice the fetus "dropping," or moving lower in your abdomen.  You may have a bloody mucus discharge. This usually occurs a few days to a week before labor begins.  Your cervix becomes thin and soft (effaced) near your due date. WHAT TO EXPECT AT YOUR PRENATAL EXAMS  You will have prenatal exams every 2 weeks until week 36. Then, you will have weekly prenatal exams. During a routine prenatal visit:  You will be weighed to make sure you and the  fetus are growing normally.  Your blood pressure is taken.  Your abdomen will be measured to track your baby's growth.  The fetal heartbeat will be listened to.  Any test results from the previous visit will be discussed.  You may have a cervical check near your due date to see if you have effaced. At around 36 weeks, your caregiver will check your cervix. At the same time, your caregiver will also perform a test on the secretions of the vaginal tissue. This test is to determine if a type of bacteria, Group B streptococcus, is present. Your caregiver will explain this further. Your caregiver may ask you:  What your birth plan is.  How you are  feeling.  If you are feeling the baby move.  If you have had any abnormal symptoms, such as leaking fluid, bleeding, severe headaches, or abdominal cramping.  If you have any questions. Other tests or screenings that may be performed during your third trimester include:  Blood tests that check for low iron levels (anemia).  Fetal testing to check the health, activity level, and growth of the fetus. Testing is done if you have certain medical conditions or if there are problems during the pregnancy. FALSE LABOR You may feel small, irregular contractions that eventually go away. These are called Braxton Hicks contractions, or false labor. Contractions may last for hours, days, or even weeks before true labor sets in. If contractions come at regular intervals, intensify, or become painful, it is best to be seen by your caregiver.  SIGNS OF LABOR   Menstrual-like cramps.  Contractions that are 5 minutes apart or less.  Contractions that start on the top of the uterus and spread down to the lower abdomen and back.  A sense of increased pelvic pressure or back pain.  A watery or bloody mucus discharge that comes from the vagina. If you have any of these signs before the 37th week of pregnancy, call your caregiver right away. You need to go to  the hospital to get checked immediately. HOME CARE INSTRUCTIONS   Avoid all smoking, herbs, alcohol, and unprescribed drugs. These chemicals affect the formation and growth of the baby.  Follow your caregiver's instructions regarding medicine use. There are medicines that are either safe or unsafe to take during pregnancy.  Exercise only as directed by your caregiver. Experiencing uterine cramps is a good sign to stop exercising.  Continue to eat regular, healthy meals.  Wear a good support bra for breast tenderness.  Do not use hot tubs, steam rooms, or saunas.  Wear your seat belt at all times when driving.  Avoid raw meat, uncooked cheese, cat litter boxes, and soil used by cats. These carry germs that can cause birth defects in the baby.  Take your prenatal vitamins.  Try taking a stool softener (if your caregiver approves) if you develop constipation. Eat more high-fiber foods, such as fresh vegetables or fruit and whole grains. Drink plenty of fluids to keep your urine clear or pale yellow.  Take warm sitz baths to soothe any pain or discomfort caused by hemorrhoids. Use hemorrhoid cream if your caregiver approves.  If you develop varicose veins, wear support hose. Elevate your feet for 15 minutes, 3-4 times a day. Limit salt in your diet.  Avoid heavy lifting, wear low heal shoes, and practice good posture.  Rest a lot with your legs elevated if you have leg cramps or low back pain.  Visit your dentist if you have not gone during your pregnancy. Use a soft toothbrush to brush your teeth and be gentle when you floss.  A sexual relationship may be continued unless your caregiver directs you otherwise.  Do not travel far distances unless it is absolutely necessary and only with the approval of your caregiver.  Take prenatal classes to understand, practice, and ask questions about the labor and delivery.  Make a trial run to the hospital.  Pack your hospital  bag.  Prepare the baby's nursery.  Continue to go to all your prenatal visits as directed by your caregiver. SEEK MEDICAL CARE IF:  You are unsure if you are in labor or if your water has broken.  You have dizziness.  You have mild pelvic cramps, pelvic pressure, or nagging pain in your abdominal area.  You have persistent nausea, vomiting, or diarrhea.  You have a bad smelling vaginal discharge.  You have pain with urination. SEEK IMMEDIATE MEDICAL CARE IF:   You have a fever.  You are leaking fluid from your vagina.  You have spotting or bleeding from your vagina.  You have severe abdominal cramping or pain.  You have rapid weight loss or gain.  You have shortness of breath with chest pain.  You notice sudden or extreme swelling of your face, hands, ankles, feet, or legs.  You have not felt your baby move in over an hour.  You have severe headaches that do not go away with medicine.  You have vision changes. Document Released: 08/01/2001 Document Revised: 08/12/2013 Document Reviewed: 10/08/2012 [facility] Patient Information 2015 [place], [place]. This information is not intended to replace advice given to you by your health care provider. Make sure you discuss any questions you have with your health care provider.   Coronavirus (COVID-19) Are you at risk?  Are you at risk for the Coronavirus (COVID-19)?  To be considered HIGH RISK for Coronavirus (COVID-19), you have to meet the following criteria:  . Traveled to [place], [place], [place], [place] or [place]; or in the [place] to [place], [place], [place], or [place]; and have fever, cough, and shortness of breath within the last 2 weeks of travel OR . Been in close contact with a person diagnosed with COVID-19 within the last 2 weeks and have fever, cough, and shortness of breath . IF YOU DO NOT MEET THESE CRITERIA, YOU ARE CONSIDERED LOW RISK FOR COVID-19.  What to do if you are HIGH RISK for  COVID-19?  Marland Kitchen If you are having a medical emergency, call 911. . Seek medical care right away. Before you go to a doctor's office, urgent care or emergency department, call ahead and tell them about your recent travel, contact with someone diagnosed with COVID-19, and your symptoms. You should receive instructions from your physician's office regarding next steps of care.  . When you arrive at healthcare provider, tell the healthcare staff immediately you have returned from visiting [place], [place], [place], [place] or [place]; or traveled in the [place] to [place], [place], [place], or [place]; in the last two weeks or you have been in close contact with a person diagnosed with COVID-19 in the last 2 weeks.   . Tell the health care staff about your symptoms: fever, cough and shortness of breath. . After you have been seen by a medical provider, you will be either: o Tested for (COVID-19) and discharged home on quarantine except to seek medical care if symptoms worsen, and asked to  - Stay home and avoid contact with others until you get your results (4-5 days)  - Avoid travel on public transportation if possible (such as bus, train, or airplane) or o Sent to the Emergency Department by EMS for evaluation, COVID-19 testing, and possible admission depending on your condition and test results.  What to do if you are LOW RISK for COVID-19?  Reduce your risk of any infection by using the same precautions used for avoiding the common cold or flu:  Marland Kitchen Wash your hands often with soap and warm water for at least 20 seconds.  If soap and water are not readily available, use an alcohol-based hand sanitizer with at least 60% alcohol.  . If coughing  or sneezing, cover your mouth and nose by coughing or sneezing into the elbow areas of your shirt or coat, into a tissue or into your sleeve (not your hands). . Avoid shaking hands with others and consider head nods or verbal greetings only. . Avoid  touching your eyes, nose, or mouth with unwashed hands.  . Avoid close contact with people who are sick. . Avoid places or events with large numbers of people in one location, like concerts or sporting events. . Carefully consider travel plans you have or are making. . If you are planning any travel outside or inside the [place], visit the CDC's Travelers' Health webpage for the latest health notices. . If you have some symptoms but not all symptoms, continue to monitor at home and seek medical attention if your symptoms worsen. . If you are having a medical emergency, call 911.   ADDITIONAL HEALTHCARE OPTIONS FOR PATIENTS  [facility] / e-Visit: [email or web address]         [facility]: [phone]  [facility]: [phone]                   [facility]: [phone]

## 2019-01-16 LAB — HIV ANTIBODY (ROUTINE TESTING W REFLEX): HIV Screen 4th Generation wRfx: NONREACTIVE

## 2019-01-16 LAB — GLUCOSE TOLERANCE, 2 HOURS W/ 1HR
Glucose, 1 hour: 85 mg/dL (ref 65–179)
Glucose, 2 hour: 99 mg/dL (ref 65–152)
Glucose, Fasting: 77 mg/dL (ref 65–91)

## 2019-01-16 LAB — ANTIBODY SCREEN: Antibody Screen: NEGATIVE

## 2019-01-16 LAB — CBC
Hematocrit: 26.5 % — ABNORMAL LOW (ref 34.0–46.6)
Hemoglobin: 9.4 g/dL — ABNORMAL LOW (ref 11.1–15.9)
MCH: 31 pg (ref 26.6–33.0)
MCHC: 35.5 g/dL (ref 31.5–35.7)
MCV: 88 fL (ref 79–97)
Platelets: 227 10*3/uL (ref 150–450)
RBC: 3.03 x10E6/uL — ABNORMAL LOW (ref 3.77–5.28)
RDW: 12.7 % (ref 11.7–15.4)
WBC: 5.4 10*3/uL (ref 3.4–10.8)

## 2019-01-16 LAB — RPR: RPR Ser Ql: NONREACTIVE

## 2019-01-28 ENCOUNTER — Telehealth: Payer: Self-pay | Admitting: *Deleted

## 2019-01-28 NOTE — Telephone Encounter (Signed)
Left message @ 9:28 am. JSY 

## 2019-01-28 NOTE — Telephone Encounter (Signed)
-----   Message from Roma Schanz, [place] sent at 01/28/2019  9:20 AM EDT ----- Will you call her, I sent her a mychart message she read, but she didn't respond. Her hemoglobin is lower than it was before. Will you ask if she is taking her pnv daily and the iron BID. If not, do it. If she is, then tell her to increase the iron to TID. Thanks!

## 2019-01-29 NOTE — Telephone Encounter (Signed)
Left message @ 2:05 pm. JSY

## 2019-02-03 NOTE — Telephone Encounter (Signed)
Left message @ 9:38 am. JSY

## 2019-02-05 NOTE — Telephone Encounter (Signed)
Spoke with pt letting her know hemoglobin is lower than it was before. Pt states she is taking her prenatal gummy daily and takes the iron 3-4 times a week because it makes her constipated. I advised to continue prenatal gummy daily and try hard to take iron BID. Pt voiced understanding. [place]

## 2019-02-12 ENCOUNTER — Other Ambulatory Visit: Payer: Self-pay

## 2019-02-12 ENCOUNTER — Encounter: Payer: Self-pay | Admitting: Women's Health

## 2019-02-12 ENCOUNTER — Ambulatory Visit (INDEPENDENT_AMBULATORY_CARE_PROVIDER_SITE_OTHER): Payer: Medicaid Other | Admitting: Women's Health

## 2019-02-12 VITALS — BP 106/65 | HR 88

## 2019-02-12 DIAGNOSIS — Z3483 Encounter for supervision of other normal pregnancy, third trimester: Secondary | ICD-10-CM

## 2019-02-12 DIAGNOSIS — Z3A31 31 weeks gestation of pregnancy: Secondary | ICD-10-CM

## 2019-02-12 DIAGNOSIS — D573 Sickle-cell trait: Secondary | ICD-10-CM | POA: Insufficient documentation

## 2019-02-12 NOTE — Progress Notes (Signed)
   [place] VIRTUAL OBSTETRICS VISIT ENCOUNTER NOTE Patient name: Jasmine Bailey MRN [id number]  Date of birth: [birth year]  I connected with patient on 02/12/19 at  9:45 AM EDT by [facility] and verified that I am speaking with the correct person using two identifiers. Due to COVID-19 recommendations, pt is not currently in our office.    I discussed the limitations, risks, security and privacy concerns of performing an evaluation and management service by telephone and the availability of in person appointments. I also discussed with the patient that there may be a patient responsible charge related to this service. The patient expressed understanding and agreed to proceed.  Chief Complaint:   Routine Prenatal Visit (difficulty walking at times)  History of Present Illness:   Jasmine Bailey is a 23 y.o. G56P1011 female at [redacted]w[redacted]d with an Estimated Date of Delivery: 04/11/19 being evaluated today for ongoing management of a low-risk pregnancy.  Today she reports pressure, hard to walk at times. Contractions: Not present. Vag. Bleeding: None.  Movement: Present. denies leaking of fluid. Review of Systems:   Pertinent items are noted in HPI Denies abnormal vaginal discharge w/ itching/odor/irritation, headaches, visual changes, shortness of breath, chest pain, abdominal pain, severe nausea/vomiting, or problems with urination or bowel movements unless otherwise stated above. Pertinent History Reviewed:  Reviewed past medical,surgical, social, obstetrical and family history.  Reviewed problem list, medications and allergies. Physical Assessment:   Vitals:   02/12/19 1017  BP: 106/65  Pulse: 88  There is no height or weight on file to calculate BMI.        Physical Examination:   General:  Alert, oriented and cooperative.   Mental Status: Normal mood and affect perceived. Normal judgment and thought content.  Rest of physical exam deferred due to type of encounter  No results found for this or any  previous visit (from the past 24 hour(s)).  Assessment & Plan:  1) Pregnancy G3P1011 at [redacted]w[redacted]d with an Estimated Date of Delivery: 04/11/19   2) Pressure/difficulty walking, try maternity belt/ktape   Meds: No orders of the defined types were placed in this encounter.   Labs/procedures today: none  Plan:  Continue routine obstetrical care.   Reviewed: Preterm labor symptoms and general obstetric precautions including but not limited to vaginal bleeding, contractions, leaking of fluid and fetal movement were reviewed in detail with the patient. The patient was advised to call back or seek an in-person office evaluation/go to [facility] for any urgent or concerning symptoms. All questions were answered. Please refer to After Visit Summary for other counseling recommendations.  Has home bp cuff. Check bp weekly, let us know if >140/90.   I provided 10 minutes of non-face-to-face time during this encounter.  Follow-up: Return in about 2 weeks (around 02/26/2019) for [place], [employer].  No orders of the defined types were placed in this encounter.  [place], [facility] 02/12/2019 10:39 AM

## 2019-02-12 NOTE — Patient Instructions (Addendum)
Jasmine Bailey, I greatly value your feedback.  If you receive a survey following your visit with [place] today, we appreciate you taking the time to fill it out.  Thanks, [name], CNM, [facility]  [place]!!! It is now [place] at [facility] ([place], [place] [address]) Entrance located off of [address] [place] parking   For your lower back pain/pressure you may:  Purchase a pregnancy belt from [employer], [employer], [employer], etc and wear it while you are up and about  Take warm baths  Use a heating pad to your lower back for no longer than 20 minutes at a time, and do not place near abdomen  Take tylenol as needed. Please follow directions on the bottle  Kinesiology tape (can get from sporting goods store), google how to tape belly for pregnancy     Call the office [phone]) or go to [facility] if:  You begin to have strong, frequent contractions  Your water breaks.  Sometimes it is a big gush of fluid, sometimes it is just a trickle that keeps getting your panties wet or running down your legs  You have vaginal bleeding.  It is normal to have a small amount of spotting if your cervix was checked.   You don't feel your baby moving like normal.  If you don't, get you something to eat and drink and lay down and focus on feeling your baby move.  You should feel at least 10 movements in 2 hours.  If you don't, you should call the office or go to [employer]    Tdap Vaccine  It is recommended that you get the Tdap vaccine during the third trimester of EACH pregnancy to help protect your baby from getting pertussis (whooping cough)  27-36 weeks is the BEST time to do this so that you can pass the protection on to your baby. During pregnancy is better than after pregnancy, but if you are unable to get it during pregnancy it will be offered at the hospital.   You can get this vaccine with [place], at the health  department, your family doctor, or some local pharmacies  Everyone who will be around your baby should also be up-to-date on their vaccines before the baby comes. Adults (who are not pregnant) only need 1 dose of Tdap during adulthood.   Home Blood Pressure Monitoring for Patients   Your provider has recommended that you check your blood pressure (BP) at least once a week at home. If you do not have a blood pressure cuff at home, one will be provided for you. Contact your provider if you have not received your monitor within 1 week.   Helpful Tips for Accurate Home Blood Pressure Checks  . Don't smoke, exercise, or drink caffeine 30 minutes before checking your BP . Use the restroom before checking your BP (a full bladder can raise your pressure) . Relax in a comfortable upright chair . Feet on the ground . Left arm resting comfortably on a flat surface at the level of your heart . Legs uncrossed . Back supported . Sit quietly and don't talk . Place the cuff on your bare arm . Adjust snuggly, so that only two fingertips can fit between your skin and the top of the cuff . Check 2 readings separated by at least one minute . Keep a log of your BP readings . For a visual, please reference this diagram:  http://ccnc.care/bpdiagram  Provider Name: [facility]     Phone: [phone]  Zone 1: ALL CLEAR  Continue to monitor your symptoms:  . BP reading is less than 140 (top number) or less than 90 (bottom number)  . No right upper stomach pain . No headaches or seeing spots . No feeling nauseated or throwing up . No swelling in face and hands  Zone 2: CAUTION Call your doctor's office for any of the following:  . BP reading is greater than 140 (top number) or greater than 90 (bottom number)  . Stomach pain under your ribs in the middle or right side . Headaches or seeing spots . Feeling nauseated or throwing up . Swelling in face and hands  Zone 3: EMERGENCY  Seek immediate  medical care if you have any of the following:  . BP reading is greater than160 (top number) or greater than 110 (bottom number) . Severe headaches not improving with Tylenol . Serious difficulty catching your breath . Any worsening symptoms from Zone 2   Third Trimester of Pregnancy The third trimester is from week 29 through week 42, months 7 through 9. The third trimester is a time when the fetus is growing rapidly. At the end of the ninth month, the fetus is about 20 inches in length and weighs 6-10 pounds.  BODY CHANGES Your body goes through many changes during pregnancy. The changes vary from woman to woman.   Your weight will continue to increase. You can expect to gain 25-35 pounds (11-16 kg) by the end of the pregnancy.  You may begin to get stretch marks on your hips, abdomen, and breasts.  You may urinate more often because the fetus is moving lower into your pelvis and pressing on your bladder.  You may develop or continue to have heartburn as a result of your pregnancy.  You may develop constipation because certain hormones are causing the muscles that push waste through your intestines to slow down.  You may develop hemorrhoids or swollen, bulging veins (varicose veins).  You may have pelvic pain because of the weight gain and pregnancy hormones relaxing your joints between the bones in your pelvis. Backaches may result from overexertion of the muscles supporting your posture.  You may have changes in your hair. These can include thickening of your hair, rapid growth, and changes in texture. Some women also have hair loss during or after pregnancy, or hair that feels dry or thin. Your hair will most likely return to normal after your baby is born.  Your breasts will continue to grow and be tender. A yellow discharge may leak from your breasts called colostrum.  Your belly button may stick out.  You may feel short of breath because of your expanding uterus.  You may  notice the fetus "dropping," or moving lower in your abdomen.  You may have a bloody mucus discharge. This usually occurs a few days to a week before labor begins.  Your cervix becomes thin and soft (effaced) near your due date. WHAT TO EXPECT AT YOUR PRENATAL EXAMS  You will have prenatal exams every 2 weeks until week 36. Then, you will have weekly prenatal exams. During a routine prenatal visit:  You will be weighed to make sure you and the fetus are growing normally.  Your blood pressure is taken.  Your abdomen will be measured to track your baby's growth.  The fetal heartbeat will be listened to.  Any test results from the previous visit  will be discussed.  You may have a cervical check near your due date to see if you have effaced. At around 36 weeks, your caregiver will check your cervix. At the same time, your caregiver will also perform a test on the secretions of the vaginal tissue. This test is to determine if a type of bacteria, Group B streptococcus, is present. Your caregiver will explain this further. Your caregiver may ask you:  What your birth plan is.  How you are feeling.  If you are feeling the baby move.  If you have had any abnormal symptoms, such as leaking fluid, bleeding, severe headaches, or abdominal cramping.  If you have any questions. Other tests or screenings that may be performed during your third trimester include:  Blood tests that check for low iron levels (anemia).  Fetal testing to check the health, activity level, and growth of the fetus. Testing is done if you have certain medical conditions or if there are problems during the pregnancy. FALSE LABOR You may feel small, irregular contractions that eventually go away. These are called Braxton Hicks contractions, or false labor. Contractions may last for hours, days, or even weeks before true labor sets in. If contractions come at regular intervals, intensify, or become painful, it is best to  be seen by your caregiver.  SIGNS OF LABOR   Menstrual-like cramps.  Contractions that are 5 minutes apart or less.  Contractions that start on the top of the uterus and spread down to the lower abdomen and back.  A sense of increased pelvic pressure or back pain.  A watery or bloody mucus discharge that comes from the vagina. If you have any of these signs before the 37th week of pregnancy, call your caregiver right away. You need to go to the hospital to get checked immediately. HOME CARE INSTRUCTIONS   Avoid all smoking, herbs, alcohol, and unprescribed drugs. These chemicals affect the formation and growth of the baby.  Follow your caregiver's instructions regarding medicine use. There are medicines that are either safe or unsafe to take during pregnancy.  Exercise only as directed by your caregiver. Experiencing uterine cramps is a good sign to stop exercising.  Continue to eat regular, healthy meals.  Wear a good support bra for breast tenderness.  Do not use hot tubs, steam rooms, or saunas.  Wear your seat belt at all times when driving.  Avoid raw meat, uncooked cheese, cat litter boxes, and soil used by cats. These carry germs that can cause birth defects in the baby.  Take your prenatal vitamins.  Try taking a stool softener (if your caregiver approves) if you develop constipation. Eat more high-fiber foods, such as fresh vegetables or fruit and whole grains. Drink plenty of fluids to keep your urine clear or pale yellow.  Take warm sitz baths to soothe any pain or discomfort caused by hemorrhoids. Use hemorrhoid cream if your caregiver approves.  If you develop varicose veins, wear support hose. Elevate your feet for 15 minutes, 3-4 times a day. Limit salt in your diet.  Avoid heavy lifting, wear low heal shoes, and practice good posture.  Rest a lot with your legs elevated if you have leg cramps or low back pain.  Visit your dentist if you have not gone during  your pregnancy. Use a soft toothbrush to brush your teeth and be gentle when you floss.  A sexual relationship may be continued unless your caregiver directs you otherwise.  Do not travel far distances  unless it is absolutely necessary and only with the approval of your caregiver.  Take prenatal classes to understand, practice, and ask questions about the labor and delivery.  Make a trial run to the hospital.  Pack your hospital bag.  Prepare the baby's nursery.  Continue to go to all your prenatal visits as directed by your caregiver. SEEK MEDICAL CARE IF:  You are unsure if you are in labor or if your water has broken.  You have dizziness.  You have mild pelvic cramps, pelvic pressure, or nagging pain in your abdominal area.  You have persistent nausea, vomiting, or diarrhea.  You have a bad smelling vaginal discharge.  You have pain with urination. SEEK IMMEDIATE MEDICAL CARE IF:   You have a fever.  You are leaking fluid from your vagina.  You have spotting or bleeding from your vagina.  You have severe abdominal cramping or pain.  You have rapid weight loss or gain.  You have shortness of breath with chest pain.  You notice sudden or extreme swelling of your face, hands, ankles, feet, or legs.  You have not felt your baby move in over an hour.  You have severe headaches that do not go away with medicine.  You have vision changes. Document Released: 08/01/2001 Document Revised: 08/12/2013 Document Reviewed: 10/08/2012 [facility] Patient Information 2015 [employer] [place]. This information is not intended to replace advice given to you by your health care provider. Make sure you discuss any questions you have with your health care provider.

## 2019-02-26 ENCOUNTER — Ambulatory Visit (INDEPENDENT_AMBULATORY_CARE_PROVIDER_SITE_OTHER): Payer: Medicaid Other | Admitting: Women's Health

## 2019-02-26 ENCOUNTER — Encounter: Payer: Self-pay | Admitting: Women's Health

## 2019-02-26 VITALS — BP 120/68 | HR 87

## 2019-02-26 DIAGNOSIS — O09299 Supervision of pregnancy with other poor reproductive or obstetric history, unspecified trimester: Secondary | ICD-10-CM

## 2019-02-26 DIAGNOSIS — Z3483 Encounter for supervision of other normal pregnancy, third trimester: Secondary | ICD-10-CM

## 2019-02-26 DIAGNOSIS — O09293 Supervision of pregnancy with other poor reproductive or obstetric history, third trimester: Secondary | ICD-10-CM

## 2019-02-26 DIAGNOSIS — Z3A33 33 weeks gestation of pregnancy: Secondary | ICD-10-CM

## 2019-02-26 NOTE — Progress Notes (Signed)
   [facility] VIRTUAL OBSTETRICS VISIT ENCOUNTER NOTE Patient name: Jasmine Bailey MRN [id number]  Date of birth: [birth year]  I connected with patient on 02/26/19 at 11:15 AM EDT by [facility] and verified that I am speaking with the correct person using two identifiers. Due to COVID-19 recommendations, pt is not currently in our office.    I discussed the limitations, risks, security and privacy concerns of performing an evaluation and management service by telephone and the availability of in person appointments. I also discussed with the patient that there may be a patient responsible charge related to this service. The patient expressed understanding and agreed to proceed.  Chief Complaint:   Routine Prenatal Visit  History of Present Illness:   Jasmine Bailey is a 23 y.o. G63P1011 female at [redacted]w[redacted]d with an Estimated Date of Delivery: 04/11/19 being evaluated today for ongoing management of a low-risk pregnancy.  Today she reports no complaints. Contractions: Not present. Vag. Bleeding: None.  Movement: Present. denies leaking of fluid. Review of Systems:   Pertinent items are noted in HPI Denies abnormal vaginal discharge w/ itching/odor/irritation, headaches, visual changes, shortness of breath, chest pain, abdominal pain, severe nausea/vomiting, or problems with urination or bowel movements unless otherwise stated above. Pertinent History Reviewed:  Reviewed past medical,surgical, social, obstetrical and family history.  Reviewed problem list, medications and allergies. Physical Assessment:   Vitals:   02/26/19 1145  BP: 120/68  Pulse: 87  There is no height or weight on file to calculate BMI.        Physical Examination:   General:  Alert, oriented and cooperative.   Mental Status: Normal mood and affect perceived. Normal judgment and thought content.  Rest of physical exam deferred due to type of encounter  No results found for this or any previous visit (from the past 24 hour(s)).   Assessment & Plan:  1) Pregnancy G3P1011 at [redacted]w[redacted]d with an Estimated Date of Delivery: 04/11/19   2) H/O shoulder dystocia> will get EFW @ 36wk visit  3) Sickle cell trait> FOB declined testing  4) Anemia> taking Fe BID when she can remember, will recheck @ 36wk visit, stressed importance of remembering to take    Meds: No orders of the defined types were placed in this encounter.   Labs/procedures today: none  Plan:  Continue routine obstetrical care.  Has home bp cuff.  Check bp weekly, let us know if >140/90.   Reviewed: Preterm labor symptoms and general obstetric precautions including but not limited to vaginal bleeding, contractions, leaking of fluid and fetal movement were reviewed in detail with the patient. The patient was advised to call back or seek an in-person office evaluation/go to [facility] for any urgent or concerning symptoms. All questions were answered. Please refer to After Visit Summary for other counseling recommendations.    I provided 10 minutes of non-face-to-face time during this encounter.  Follow-up: Return in about 2 weeks (around 03/12/2019) for [place], [employer], then 7/29 for EFW u/s and LROB in person (gbs, tdap, Hgb).  Orders Placed This Encounter  Procedures  . US OB Follow Up   [place], [facility] 02/26/2019 12:10 PM

## 2019-02-26 NOTE — Patient Instructions (Signed)
Jasmine Bailey, I greatly value your feedback.  If you receive a survey following your visit with [place] today, we appreciate you taking the time to fill it out.  Thanks, [name], CNM, [facility]  [place]!!! It is now [place] at [facility] ([place], [place] [address]) Entrance located off of [address] parking    Call the office [phone]) or go to [facility] if:  You begin to have strong, frequent contractions  Your water breaks.  Sometimes it is a big gush of fluid, sometimes it is just a trickle that keeps getting your panties wet or running down your legs  You have vaginal bleeding.  It is normal to have a small amount of spotting if your cervix was checked.   You don't feel your baby moving like normal.  If you don't, get you something to eat and drink and lay down and focus on feeling your baby move.  You should feel at least 10 movements in 2 hours.  If you don't, you should call the office or go to [place] Blood Pressure Monitoring for Patients   Your provider has recommended that you check your blood pressure (BP) at least once a week at home. If you do not have a blood pressure cuff at home, one will be provided for you. Contact your provider if you have not received your monitor within 1 week.   Helpful Tips for Accurate Home Blood Pressure Checks  . Don't smoke, exercise, or drink caffeine 30 minutes before checking your BP . Use the restroom before checking your BP (a full bladder can raise your pressure) . Relax in a comfortable upright chair . Feet on the ground . Left arm resting comfortably on a flat surface at the level of your heart . Legs uncrossed . Back supported . Sit quietly and don't talk . Place the cuff on your bare arm . Adjust snuggly, so that only two fingertips can fit between your skin and the top of the cuff . Check 2 readings separated by at least one minute .  Keep a log of your BP readings . For a visual, please reference this diagram: http://ccnc.care/bpdiagram  Provider Name: [facility]     Phone: [phone]  Zone 1: ALL CLEAR  Continue to monitor your symptoms:  . BP reading is less than 140 (top number) or less than 90 (bottom number)  . No right upper stomach pain . No headaches or seeing spots . No feeling nauseated or throwing up . No swelling in face and hands  Zone 2: CAUTION Call your doctor's office for any of the following:  . BP reading is greater than 140 (top number) or greater than 90 (bottom number)  . Stomach pain under your ribs in the middle or right side . Headaches or seeing spots . Feeling nauseated or throwing up . Swelling in face and hands  Zone 3: EMERGENCY  Seek immediate medical care if you have any of the following:  . BP reading is greater than160 (top number) or greater than 110 (bottom number) . Severe headaches not improving with Tylenol . Serious difficulty catching your breath . Any worsening symptoms from Zone 2    Preterm Labor and Birth Information  The normal length of a pregnancy is 39-41 weeks. Preterm labor is when labor starts before 37 completed weeks of pregnancy. What are the risk factors for preterm  labor? Preterm labor is more likely to occur in women who:  Have certain infections during pregnancy such as a bladder infection, sexually transmitted infection, or infection inside the uterus (chorioamnionitis).  Have a shorter-than-normal cervix.  Have gone into preterm labor before.  Have had surgery on their cervix.  Are younger than age 25 or older than age 25.  Are African American.  Are pregnant with twins or multiple babies (multiple gestation).  Take street drugs or smoke while pregnant.  Do not gain enough weight while pregnant.  Became pregnant shortly after having been pregnant. What are the symptoms of preterm labor? Symptoms of preterm labor include:   Cramps similar to those that can happen during a menstrual period. The cramps may happen with diarrhea.  Pain in the abdomen or lower back.  Regular uterine contractions that may feel like tightening of the abdomen.  A feeling of increased pressure in the pelvis.  Increased watery or bloody mucus discharge from the vagina.  Water breaking (ruptured amniotic sac). Why is it important to recognize signs of preterm labor? It is important to recognize signs of preterm labor because babies who are born prematurely may not be fully developed. This can put them at an increased risk for:  Long-term (chronic) heart and lung problems.  Difficulty immediately after birth with regulating body systems, including blood sugar, body temperature, heart rate, and breathing rate.  Bleeding in the brain.  Cerebral palsy.  Learning difficulties.  Death. These risks are highest for babies who are born before 34 weeks of pregnancy. How is preterm labor treated? Treatment depends on the length of your pregnancy, your condition, and the health of your baby. It may involve:  Having a stitch (suture) placed in your cervix to prevent your cervix from opening too early (cerclage).  Taking or being given medicines, such as: ? Hormone medicines. These may be given early in pregnancy to help support the pregnancy. ? Medicine to stop contractions. ? Medicines to help mature the baby's lungs. These may be prescribed if the risk of delivery is high. ? Medicines to prevent your baby from developing cerebral palsy. If the labor happens before 34 weeks of pregnancy, you may need to stay in the hospital. What should I do if I think I am in preterm labor? If you think that you are going into preterm labor, call your health care provider right away. How can I prevent preterm labor in future pregnancies? To increase your chance of having a full-term pregnancy:  Do not use any tobacco products, such as cigarettes,  chewing tobacco, and e-cigarettes. If you need help quitting, ask your health care provider.  Do not use street drugs or medicines that have not been prescribed to you during your pregnancy.  Talk with your health care provider before taking any herbal supplements, even if you have been taking them regularly.  Make sure you gain a healthy amount of weight during your pregnancy.  Watch for infection. If you think that you might have an infection, get it checked right away.  Make sure to tell your health care provider if you have gone into preterm labor before. This information is not intended to replace advice given to you by your health care provider. Make sure you discuss any questions you have with your health care provider. Document Released: 10/28/2003 Document Revised: 11/29/2018 Document Reviewed: 12/29/2015 Elsevier Patient Education  2020 [employer]

## 2019-03-12 ENCOUNTER — Ambulatory Visit (INDEPENDENT_AMBULATORY_CARE_PROVIDER_SITE_OTHER): Payer: Medicaid Other | Admitting: Women's Health

## 2019-03-12 ENCOUNTER — Encounter: Payer: Self-pay | Admitting: Women's Health

## 2019-03-12 ENCOUNTER — Other Ambulatory Visit: Payer: Self-pay

## 2019-03-12 VITALS — BP 100/57 | HR 66

## 2019-03-12 DIAGNOSIS — Z3A35 35 weeks gestation of pregnancy: Secondary | ICD-10-CM

## 2019-03-12 DIAGNOSIS — Z8759 Personal history of other complications of pregnancy, childbirth and the puerperium: Secondary | ICD-10-CM

## 2019-03-12 DIAGNOSIS — Z3483 Encounter for supervision of other normal pregnancy, third trimester: Secondary | ICD-10-CM

## 2019-03-12 NOTE — Patient Instructions (Signed)
Jasmine Bailey, I greatly value your feedback.  If you receive a survey following your visit with [place] today, we appreciate you taking the time to fill it out.  Thanks, [name], CNM, [facility]  [place]!!! It is now [place] at [facility] ([place] [place], [place] [address]) Entrance located off of [address] parking   Go to [email or web address] to register for FREE online childbirth classes    Call the office [phone]) or go to [facility] if:  You begin to have strong, frequent contractions  Your water breaks.  Sometimes it is a big gush of fluid, sometimes it is just a trickle that keeps getting your panties wet or running down your legs  You have vaginal bleeding.  It is normal to have a small amount of spotting if your cervix was checked.   You don't feel your baby moving like normal.  If you don't, get you something to eat and drink and lay down and focus on feeling your baby move.  You should feel at least 10 movements in 2 hours.  If you don't, you should call the office or go to [place] Blood Pressure Monitoring for Patients   Your provider has recommended that you check your blood pressure (BP) at least once a week at home. If you do not have a blood pressure cuff at home, one will be provided for you. Contact your provider if you have not received your monitor within 1 week.   Helpful Tips for Accurate Home Blood Pressure Checks  . Don't smoke, exercise, or drink caffeine 30 minutes before checking your BP . Use the restroom before checking your BP (a full bladder can raise your pressure) . Relax in a comfortable upright chair . Feet on the ground . Left arm resting comfortably on a flat surface at the level of your heart . Legs uncrossed . Back supported . Sit quietly and don't talk . Place the cuff on your bare arm . Adjust snuggly, so that only two fingertips can fit between your skin and  the top of the cuff . Check 2 readings separated by at least one minute . Keep a log of your BP readings . For a visual, please reference this diagram: http://ccnc.care/bpdiagram  Provider Name: [facility]     Phone: [phone]  Zone 1: ALL CLEAR  Continue to monitor your symptoms:  . BP reading is less than 140 (top number) or less than 90 (bottom number)  . No right upper stomach pain . No headaches or seeing spots . No feeling nauseated or throwing up . No swelling in face and hands  Zone 2: CAUTION Call your doctor's office for any of the following:  . BP reading is greater than 140 (top number) or greater than 90 (bottom number)  . Stomach pain under your ribs in the middle or right side . Headaches or seeing spots . Feeling nauseated or throwing up . Swelling in face and hands  Zone 3: EMERGENCY  Seek immediate medical care if you have any of the following:  . BP reading is greater than160 (top number) or greater than 110 (bottom number) . Severe headaches not improving with Tylenol . Serious difficulty catching your breath . Any worsening symptoms from Zone 2    Preterm Labor and Birth Information  The normal length of a pregnancy is 39-41 weeks. Preterm labor is when labor starts before  37 completed weeks of pregnancy. What are the risk factors for preterm labor? Preterm labor is more likely to occur in women who:  Have certain infections during pregnancy such as a bladder infection, sexually transmitted infection, or infection inside the uterus (chorioamnionitis).  Have a shorter-than-normal cervix.  Have gone into preterm labor before.  Have had surgery on their cervix.  Are younger than age 10 or older than age 73.  Are African American.  Are pregnant with twins or multiple babies (multiple gestation).  Take street drugs or smoke while pregnant.  Do not gain enough weight while pregnant.  Became pregnant shortly after having been pregnant.  What are the symptoms of preterm labor? Symptoms of preterm labor include:  Cramps similar to those that can happen during a menstrual period. The cramps may happen with diarrhea.  Pain in the abdomen or lower back.  Regular uterine contractions that may feel like tightening of the abdomen.  A feeling of increased pressure in the pelvis.  Increased watery or bloody mucus discharge from the vagina.  Water breaking (ruptured amniotic sac). Why is it important to recognize signs of preterm labor? It is important to recognize signs of preterm labor because babies who are born prematurely may not be fully developed. This can put them at an increased risk for:  Long-term (chronic) heart and lung problems.  Difficulty immediately after birth with regulating body systems, including blood sugar, body temperature, heart rate, and breathing rate.  Bleeding in the brain.  Cerebral palsy.  Learning difficulties.  Death. These risks are highest for babies who are born before 32 weeks of pregnancy. How is preterm labor treated? Treatment depends on the length of your pregnancy, your condition, and the health of your baby. It may involve:  Having a stitch (suture) placed in your cervix to prevent your cervix from opening too early (cerclage).  Taking or being given medicines, such as: ? Hormone medicines. These may be given early in pregnancy to help support the pregnancy. ? Medicine to stop contractions. ? Medicines to help mature the baby's lungs. These may be prescribed if the risk of delivery is high. ? Medicines to prevent your baby from developing cerebral palsy. If the labor happens before 34 weeks of pregnancy, you may need to stay in the hospital. What should I do if I think I am in preterm labor? If you think that you are going into preterm labor, call your health care provider right away. How can I prevent preterm labor in future pregnancies? To increase your chance of having a  full-term pregnancy:  Do not use any tobacco products, such as cigarettes, chewing tobacco, and e-cigarettes. If you need help quitting, ask your health care provider.  Do not use street drugs or medicines that have not been prescribed to you during your pregnancy.  Talk with your health care provider before taking any herbal supplements, even if you have been taking them regularly.  Make sure you gain a healthy amount of weight during your pregnancy.  Watch for infection. If you think that you might have an infection, get it checked right away.  Make sure to tell your health care provider if you have gone into preterm labor before. This information is not intended to replace advice given to you by your health care provider. Make sure you discuss any questions you have with your health care provider. Document Released: 10/28/2003 Document Revised: 11/29/2018 Document Reviewed: 12/29/2015 Elsevier Patient Education  2020 [employer].

## 2019-03-12 NOTE — Progress Notes (Signed)
   [place] VIRTUAL OBSTETRICS VISIT ENCOUNTER NOTE Patient name: Jasmine Bailey MRN [id number]  Date of birth: 09/30/1995  I connected with patient on 03/12/19 at  9:45 AM EDT by [facility] and verified that I am speaking with the correct person using two identifiers. Due to COVID-19 recommendations, pt is not currently in our office.    I discussed the limitations, risks, security and privacy concerns of performing an evaluation and management service by telephone and the availability of in person appointments. I also discussed with the patient that there may be a patient responsible charge related to this service. The patient expressed understanding and agreed to proceed.  Chief Complaint:   Routine Prenatal Visit  History of Present Illness:   Jasmine Bailey is a 23 y.o. G46P1011 female at [redacted]w[redacted]d with an Estimated Date of Delivery: 04/11/19 being evaluated today for ongoing management of a low-risk pregnancy.  Today she reports lower abd pain/pressure. Contractions: Irritability. Vag. Bleeding: None.  Movement: Present. denies leaking of fluid. Review of Systems:   Pertinent items are noted in HPI Denies abnormal vaginal discharge w/ itching/odor/irritation, headaches, visual changes, shortness of breath, chest pain, abdominal pain, severe nausea/vomiting, or problems with urination or bowel movements unless otherwise stated above. Pertinent History Reviewed:  Reviewed past medical,surgical, social, obstetrical and family history.  Reviewed problem list, medications and allergies. Physical Assessment:   Vitals:   03/12/19 0931  BP: (!) 100/57  Pulse: 66  There is no height or weight on file to calculate BMI.        Physical Examination:   General:  Alert, oriented and cooperative.   Mental Status: Normal mood and affect perceived. Normal judgment and thought content.  Rest of physical exam deferred due to type of encounter  No results found for this or any previous visit (from the past 24  hour(s)).  Assessment & Plan:  1) Pregnancy G3P1011 at [redacted]w[redacted]d with an Estimated Date of Delivery: 04/11/19   2) H/O shoulder dystocia, EFW next week  3) Sickle cell trait  4) Anemia> taking Fe   Meds: No orders of the defined types were placed in this encounter.   Labs/procedures today: none  Plan:  Continue routine obstetrical care.  Has home bp cuff.  Check bp weekly, let us know if >140/90.   Reviewed: Preterm labor symptoms and general obstetric precautions including but not limited to vaginal bleeding, contractions, leaking of fluid and fetal movement were reviewed in detail with the patient. The patient was advised to call back or seek an in-person office evaluation/go to MAU at [facility] for any urgent or concerning symptoms. All questions were answered. Please refer to After Visit Summary for other counseling recommendations.    I provided 10 minutes of non-face-to-face time during this encounter.  Follow-up: Return in about 1 week (around 03/19/2019) for LROB, in person, gbs, gc/ct, tdap, US:EFW.  Orders Placed This Encounter  Procedures  . US OB Follow Up   [place], [facility] 03/12/2019 9:47 AM

## 2019-03-19 ENCOUNTER — Encounter: Payer: Self-pay | Admitting: Obstetrics and Gynecology

## 2019-03-19 ENCOUNTER — Ambulatory Visit (INDEPENDENT_AMBULATORY_CARE_PROVIDER_SITE_OTHER): Payer: 59

## 2019-03-19 ENCOUNTER — Other Ambulatory Visit: Payer: Self-pay

## 2019-03-19 ENCOUNTER — Ambulatory Visit (INDEPENDENT_AMBULATORY_CARE_PROVIDER_SITE_OTHER): Payer: 59 | Admitting: Obstetrics and Gynecology

## 2019-03-19 VITALS — BP 106/67 | HR 82 | Wt 160.0 lb

## 2019-03-19 DIAGNOSIS — O99013 Anemia complicating pregnancy, third trimester: Secondary | ICD-10-CM | POA: Diagnosis not present

## 2019-03-19 DIAGNOSIS — Z3483 Encounter for supervision of other normal pregnancy, third trimester: Secondary | ICD-10-CM | POA: Diagnosis not present

## 2019-03-19 DIAGNOSIS — Z3A36 36 weeks gestation of pregnancy: Secondary | ICD-10-CM

## 2019-03-19 DIAGNOSIS — D62 Acute posthemorrhagic anemia: Secondary | ICD-10-CM

## 2019-03-19 DIAGNOSIS — Z23 Encounter for immunization: Secondary | ICD-10-CM

## 2019-03-19 DIAGNOSIS — Z1389 Encounter for screening for other disorder: Secondary | ICD-10-CM

## 2019-03-19 DIAGNOSIS — O09299 Supervision of pregnancy with other poor reproductive or obstetric history, unspecified trimester: Secondary | ICD-10-CM

## 2019-03-19 DIAGNOSIS — O09293 Supervision of pregnancy with other poor reproductive or obstetric history, third trimester: Secondary | ICD-10-CM | POA: Diagnosis not present

## 2019-03-19 DIAGNOSIS — Z331 Pregnant state, incidental: Secondary | ICD-10-CM

## 2019-03-19 LAB — POCT URINALYSIS DIPSTICK OB
Blood, UA: NEGATIVE
Glucose, UA: NEGATIVE
Leukocytes, UA: NEGATIVE
Nitrite, UA: NEGATIVE
POC,PROTEIN,UA: NEGATIVE

## 2019-03-19 LAB — POCT HEMOGLOBIN: Hemoglobin: 9.1 g/dL — AB (ref 11–14.6)

## 2019-03-19 NOTE — Progress Notes (Signed)
[place] 82+5 wks,cephalic,posterior placenta gr 3,normal ovaries bilat,fhr 148 bpm,afi 24.5 cm,efw 3162 g 69%

## 2019-03-19 NOTE — Progress Notes (Signed)
Patient ID: Jasmine KitchensKeyona Coppedge, female   DOB: 08/13/1996, [age] y.o.   MRN: [id number]    LOW-RISK PREGNANCY VISIT Patient name: Jasmine Bailey MRN [id number]  Date of birth: 11/27/1995 Chief Complaint:   Routine Prenatal Visit (gbs,gc,chl/ tdap)  History of Present Illness:   Jasmine KitchensKeyona Fitton is a 23 y.o. 323P1011 female at 31106w5d with an Estimated Date of Delivery: 04/11/19 being seen today for ongoing management of a low-risk pregnancy.  Today she reports no complaints. Contractions: Not present.  .  Movement: Present. denies leaking of fluid. Review of Systems:   Pertinent items are noted in HPI Denies abnormal vaginal discharge w/ itching/odor/irritation, headaches, visual changes, shortness of breath, chest pain, abdominal pain, severe nausea/vomiting, or problems with urination or bowel movements unless otherwise stated above. Pertinent History Reviewed:  Reviewed past medical,surgical, social, obstetrical and family history.  Reviewed problem list, medications and allergies. Physical Assessment:   Vitals:   03/19/19 1227  BP: 106/67  Pulse: 82  Weight: 160 lb (72.6 kg)  Body mass index is 29.26 kg/m.        Physical Examination:   General appearance: Well appearing, and in no distress  Mental status: Alert, oriented to person, place, and time  Skin: Warm & dry  Cardiovascular: Normal heart rate noted  Respiratory: Normal respiratory effort, no distress  Abdomen: Soft, gravid, nontender  Pelvic: Cervical exam performed ballotable 3 cm, long -3        Extremities: Edema: Trace  Fetal Status: Fetal Heart Rate (bpm): 148 u/s Fundal Height: 38 cm Movement: Present    Results for orders placed or performed in visit on 03/19/19 (from the past 24 hour(s))  POC Urinalysis Dipstick OB   Collection Time: 03/19/19 12:30 PM  Result Value Ref Range   Color, UA     Clarity, UA     Glucose, UA Negative Negative   Bilirubin, UA     Ketones, UA large    Spec Grav, UA     Blood, UA neg    pH,  UA     POC,PROTEIN,UA Negative Negative, Trace, Small (1+), Moderate (2+), Large (3+), 4+   Urobilinogen, UA     Nitrite, UA neg    Leukocytes, UA Negative Negative   Appearance     Odor    POCT hemoglobin   Collection Time: 03/19/19 12:39 PM  Result Value Ref Range   Hemoglobin 9.1 (A) 11 - 14.6 g/dL    Assessment & Plan:  1) Low-risk pregnancy G3P1011 at 87106w5d with an Estimated Date of Delivery: 04/11/19   2) Anemia   Meds: No orders of the defined types were placed in this encounter.  Labs/procedures today: t-dap, gbs/gcchl, US 36+5 wks,cephalic,posterior placenta gr 3,normal ovaries bilat,fhr 148 bpm,afi 24.5 cm,efw 3162 g 69%  Plan:  Continue routine obstetrical care f/u 1 week Take iron BID with stool softner  Reviewed: Preterm labor symptoms and general obstetric precautions including but not limited to vaginal bleeding, contractions, leaking of fluid and fetal movement were reviewed in detail with the patient.  All questions were answered.  Follow-up: Return in about 1 year (around 03/18/2020).   F/u 1 wk tele Orders Placed This Encounter  Procedures  . Strep Gp B NAA  . GC/Chlamydia Probe Amp  . Tdap vaccine greater than or equal to 7yo IM  . POC Urinalysis Dipstick OB  . POCT hemoglobin   By signing my name below, I, Arnette NorrisMari Badour, attest that this documentation has been prepared under the direction and  in the presence of Jonnie Kind, MD. Electronically Signed: [place]. 03/19/19. 12:58 PM.  I personally performed the services described in this documentation, which was SCRIBED in my presence. The recorded information has been reviewed and considered accurate. It has been edited as necessary during review. Jonnie Kind, MD

## 2019-03-21 LAB — STREP GP B NAA: Strep Gp B NAA: POSITIVE — AB

## 2019-03-21 LAB — SPECIMEN STATUS REPORT

## 2019-03-24 ENCOUNTER — Encounter: Payer: Self-pay | Admitting: Women's Health

## 2019-03-24 DIAGNOSIS — O409XX0 Polyhydramnios, unspecified trimester, not applicable or unspecified: Secondary | ICD-10-CM | POA: Insufficient documentation

## 2019-03-25 LAB — GC/CHLAMYDIA PROBE AMP
Chlamydia trachomatis, NAA: NEGATIVE
Neisseria Gonorrhoeae by PCR: NEGATIVE

## 2019-03-25 LAB — SPECIMEN STATUS REPORT

## 2019-03-26 ENCOUNTER — Encounter: Payer: Self-pay | Admitting: Obstetrics and Gynecology

## 2019-03-26 ENCOUNTER — Other Ambulatory Visit: Payer: Self-pay

## 2019-03-26 ENCOUNTER — Telehealth (INDEPENDENT_AMBULATORY_CARE_PROVIDER_SITE_OTHER): Payer: Medicaid Other | Admitting: Obstetrics and Gynecology

## 2019-03-26 VITALS — BP 117/80 | HR 86

## 2019-03-26 DIAGNOSIS — O99820 Streptococcus B carrier state complicating pregnancy: Secondary | ICD-10-CM | POA: Insufficient documentation

## 2019-03-26 DIAGNOSIS — Z3483 Encounter for supervision of other normal pregnancy, third trimester: Secondary | ICD-10-CM

## 2019-03-26 DIAGNOSIS — Z3A37 37 weeks gestation of pregnancy: Secondary | ICD-10-CM

## 2019-03-26 DIAGNOSIS — O403XX0 Polyhydramnios, third trimester, not applicable or unspecified: Secondary | ICD-10-CM

## 2019-03-26 NOTE — Progress Notes (Signed)
[place] VIRTUAL VIDEO VISIT ENCOUNTER NOTE  Provider location: Center for [place] at [facility]   I connected with Jasmine Bailey on 03/26/19 at  1:30 PM EDT by MyChart Video Encounter at home and verified that I am speaking with the correct person using two identifiers.   I discussed the limitations, risks, security and privacy concerns of performing an evaluation and management service virtually and the availability of in person appointments. I also discussed with the patient that there may be a patient responsible charge related to this service. The patient expressed understanding and agreed to proceed. Subjective:  Jasmine Bailey is a 23 y.o. G3P1011 at [redacted]w[redacted]d being seen today for ongoing prenatal care.  She is currently monitored for the following issues for this low-risk pregnancy and has Eczema; Shoulder dystocia; Late prenatal care in second trimester; Supervision of normal pregnancy; Anemia; Sickle cell trait ([place]); Polyhydramnios; and GBS (group B Streptococcus carrier), +RV culture, currently pregnant on their problem list.  Patient reports no complaints.  Contractions: Irritability. Vag. Bleeding: None.  Movement: Present. Denies any leaking of fluid.   The following portions of the patient's history were reviewed and updated as appropriate: allergies, current medications, past family history, past medical history, past social history, past surgical history and problem list.   Objective:   Vitals:   03/26/19 1338  BP: 117/80  Pulse: 86    Fetal Status:     Movement: Present     General:  Alert, oriented and cooperative. Patient is in no acute distress.  Respiratory: Normal respiratory effort, no problems with respiration noted  Mental Status: Normal mood and affect. Normal behavior. Normal judgment and thought content.  Rest of physical exam deferred due to type of encounter  Imaging: US Ob Follow Up  Result Date: 03/23/2019 FOLLOW UP  SONOGRAM Jasmine Bailey is in the office for a follow up sonogram for EFW. She is a 23 y.o. year old G61P1011 with Estimated Date of Delivery: 04/11/19 by early ultrasound now at  [redacted]w[redacted]d weeks gestation. Thus far the pregnancy has been complicated by LPNC,sickle cell trait,hx of shoulder dystocia. GESTATION:SINGLETON PRESENTATION: cephalic FETAL ACTIVITY:          Heart rate         148          The fetus is active. AMNIOTIC FLUID: The amniotic fluid volume is  normal, 24.5 cm.borderline polyhydramnios PLACENTA LOCALIZATION:  posterior GRADE 3 CERVIX: Limited view ADNEXA: The ovaries are normal. GESTATIONAL AGE AND  BIOMETRICS: Gestational criteria: Estimated Date of Delivery: 04/11/19 by early ultrasound now at [redacted]w[redacted]d Previous Scans:1          BIPARIETAL DIAMETER           9.04 cm         36+4 weeks HEAD CIRCUMFERENCE           32.68 cm         37 weeks ABDOMINAL CIRCUMFERENCE           33.87 cm         37+5 weeks FEMUR LENGTH           7.13 cm         36+3 weeks  AVERAGE EGA(BY THIS SCAN):  37 weeks                                                 ESTIMATED FETAL WEIGHT:       3162  grams, 69 % ANATOMICAL SURVEY                                                                            COMMENTS CEREBRAL VENTRICLES yes normal  CHOROID PLEXUS yes normal  CEREBELLUM yes normal  CISTERNA MAGNA yes normal  NUCHAL REGION yes normal          NOSE/LIP yes normal  FACIAL PROFILE yes normal  4 CHAMBERED HEART yes normal  OUTFLOW TRACTS yes normal  DIAPHRAGM yes normal  STOMACH yes normal  RENAL REGION yes normal  BLADDER yes normal  CORD INSERTION yes normal  3 VESSEL CORD yes normal  SPINE yes normal          GENITALIA yes normal female     SUSPECTED ABNORMALITIES:  borderline polyhydramnios QUALITY OF SCAN: satisfactory TECHNICIAN COMMENTS: US 36+5 wks,cephalic,posterior placenta gr 3,normal ovaries bilat,fhr 148 bpm,afi 24.5 cm,efw 3162 g 69% A copy of this report including  all images has been saved and backed up to a second source for retrieval if needed. All measures and details of the anatomical scan, placentation, fluid volume and pelvic anatomy are contained in that report. Jasmine Bailey 03/19/2019 12:26 PM Clinical Impression and recommendations: I have reviewed the sonogram results above, combined with the patient's current clinical course, below are my impressions and any appropriate recommendations for management based on the sonographic findings. 36 w EFW requested due to hx Shoulder dystocia with LGA 8# baby 1.  [id number] Estimated Date of Delivery: 04/11/19 by  LMP, midtrimester ultrasound and confirmed by today's sonographic dating 2.  Normal fetal sonographic findings, specifically normal detailed anatomical evaluation,      no abnormalities noted 3.  Normal general sonographic findings growth comfortably at normal range at 69%ile. EFW 3162 g at 36 w. Recommend routine prenatal care based on this sonogram or as clinically indicated Tilda BurrowJohn V Bailey 03/23/2019 2:59 PM    Assessment and Plan:  Pregnancy: G3P1011 at 293w5d 1. Encounter for supervision of other normal pregnancy in third trimester Stable Labor precautions  2. GBS (group B Streptococcus carrier), +RV culture, currently pregnant Tx while in labor  3. Polyhydramnios in third trimester complication, single or unspecified fetus AFI 24.5 at 36 weeks  Preterm labor symptoms and general obstetric precautions including but not limited to vaginal bleeding, contractions, leaking of fluid and fetal movement were reviewed in detail with the patient. I discussed the assessment and treatment plan with the patient. The patient was provided an opportunity to ask questions and all were answered. The patient agreed with the plan and demonstrated an understanding of the instructions. The patient was advised to call back or seek an in-person office evaluation/[facility] for any urgent or  concerning symptoms. Please refer to After Visit Summary for other counseling recommendations.  I provided 10 minutes of face-to-face time during this encounter.  Return in about 1 week (around 04/02/2019) for OB visit, face to face.  No future appointments.  Hermina StaggersMichael L Ervin, MD [facility] for [facility], [facility]

## 2019-04-06 ENCOUNTER — Inpatient Hospital Stay (HOSPITAL_COMMUNITY)
Admission: EM | Admit: 2019-04-06 | Discharge: 2019-04-08 | DRG: 807 | Disposition: A | Discharge status: Home / Self Care | Payer: 59 | Attending: Obstetrics and Gynecology | Admitting: Obstetrics and Gynecology

## 2019-04-06 ENCOUNTER — Other Ambulatory Visit: Payer: Self-pay

## 2019-04-06 ENCOUNTER — Inpatient Hospital Stay (HOSPITAL_COMMUNITY): Payer: 59 | Admitting: Anesthesiology

## 2019-04-06 ENCOUNTER — Encounter (HOSPITAL_COMMUNITY): Payer: Self-pay

## 2019-04-06 DIAGNOSIS — O4292 Full-term premature rupture of membranes, unspecified as to length of time between rupture and onset of labor: Secondary | ICD-10-CM | POA: Diagnosis not present

## 2019-04-06 DIAGNOSIS — O0932 Supervision of pregnancy with insufficient antenatal care, second trimester: Secondary | ICD-10-CM

## 2019-04-06 DIAGNOSIS — O403XX0 Polyhydramnios, third trimester, not applicable or unspecified: Secondary | ICD-10-CM | POA: Diagnosis not present

## 2019-04-06 DIAGNOSIS — O99824 Streptococcus B carrier state complicating childbirth: Secondary | ICD-10-CM | POA: Diagnosis present

## 2019-04-06 DIAGNOSIS — O9902 Anemia complicating childbirth: Secondary | ICD-10-CM | POA: Diagnosis not present

## 2019-04-06 DIAGNOSIS — Z3A39 39 weeks gestation of pregnancy: Secondary | ICD-10-CM

## 2019-04-06 DIAGNOSIS — Z87891 Personal history of nicotine dependence: Secondary | ICD-10-CM

## 2019-04-06 DIAGNOSIS — Z20828 Contact with and (suspected) exposure to other viral communicable diseases: Secondary | ICD-10-CM | POA: Diagnosis present

## 2019-04-06 DIAGNOSIS — D573 Sickle-cell trait: Secondary | ICD-10-CM | POA: Diagnosis present

## 2019-04-06 DIAGNOSIS — Z3483 Encounter for supervision of other normal pregnancy, third trimester: Secondary | ICD-10-CM

## 2019-04-06 DIAGNOSIS — O4290 Premature rupture of membranes, unspecified as to length of time between rupture and onset of labor, unspecified weeks of gestation: Secondary | ICD-10-CM | POA: Diagnosis present

## 2019-04-06 DIAGNOSIS — D649 Anemia, unspecified: Secondary | ICD-10-CM | POA: Diagnosis not present

## 2019-04-06 DIAGNOSIS — O4202 Full-term premature rupture of membranes, onset of labor within 24 hours of rupture: Secondary | ICD-10-CM | POA: Diagnosis not present

## 2019-04-06 LAB — TYPE AND SCREEN
ABO/RH(D): O POS
Antibody Screen: NEGATIVE

## 2019-04-06 LAB — CBC
HCT: 27.4 % — ABNORMAL LOW (ref 36.0–46.0)
Hemoglobin: 9.5 g/dL — ABNORMAL LOW (ref 12.0–15.0)
MCH: 31.3 pg (ref 26.0–34.0)
MCHC: 34.7 g/dL (ref 30.0–36.0)
MCV: 90.1 fL (ref 80.0–100.0)
Platelets: 217 10*3/uL (ref 150–400)
RBC: 3.04 MIL/uL — ABNORMAL LOW (ref 3.87–5.11)
RDW: 13.7 % (ref 11.5–15.5)
WBC: 7.1 10*3/uL (ref 4.0–10.5)
nRBC: 0 % (ref 0.0–0.2)

## 2019-04-06 LAB — ABO/RH: ABO/RH(D): O POS

## 2019-04-06 LAB — SARS CORONAVIRUS 2 BY RT PCR (HOSPITAL ORDER, PERFORMED IN ~~LOC~~ HOSPITAL LAB): SARS Coronavirus 2: NEGATIVE

## 2019-04-06 LAB — RPR: RPR Ser Ql: NONREACTIVE

## 2019-04-06 MED ORDER — LACTATED RINGERS IV SOLN
500.0000 mL | Freq: Once | INTRAVENOUS | Status: AC
  Administered 2019-04-06: 18:00:00 500 mL via INTRAVENOUS

## 2019-04-06 MED ORDER — ONDANSETRON HCL 4 MG/2ML IJ SOLN: 4.0000 mg | Freq: Four times a day (QID) | INTRAMUSCULAR | Status: DC | PRN

## 2019-04-06 MED ORDER — LACTATED RINGERS IV SOLN
INTRAVENOUS | Status: DC
  Administered 2019-04-06 – 2019-04-07 (×3): via INTRAVENOUS

## 2019-04-06 MED ORDER — TERBUTALINE SULFATE 1 MG/ML IJ SOLN: 0.2500 mg | Freq: Once | INTRAMUSCULAR | Status: DC | PRN

## 2019-04-06 MED ORDER — OXYCODONE-ACETAMINOPHEN 5-325 MG PO TABS: 2.0000 | ORAL_TABLET | ORAL | Status: DC | PRN

## 2019-04-06 MED ORDER — OXYCODONE-ACETAMINOPHEN 5-325 MG PO TABS: 1.0000 | ORAL_TABLET | ORAL | Status: DC | PRN

## 2019-04-06 MED ORDER — SODIUM CHLORIDE (PF) 0.9 % IJ SOLN
INTRAMUSCULAR | Status: DC | PRN
  Administered 2019-04-06: 12 mL/h via EPIDURAL

## 2019-04-06 MED ORDER — OXYTOCIN 40 UNITS IN NORMAL SALINE INFUSION - SIMPLE MED
2.5000 [IU]/h | INTRAVENOUS | Status: DC
  Filled 2019-04-06: qty 1000

## 2019-04-06 MED ORDER — OXYTOCIN 40 UNITS IN NORMAL SALINE INFUSION - SIMPLE MED
1.0000 m[IU]/min | INTRAVENOUS | Status: DC
  Administered 2019-04-06: 10:00:00 2 m[IU]/min via INTRAVENOUS
  Filled 2019-04-06 (×2): qty 1000

## 2019-04-06 MED ORDER — LACTATED RINGERS IV SOLN: 500.0000 mL | Freq: Once | INTRAVENOUS | Status: DC

## 2019-04-06 MED ORDER — FENTANYL-BUPIVACAINE-NACL 0.5-0.125-0.9 MG/250ML-% EP SOLN
12.0000 mL/h | EPIDURAL | Status: DC | PRN
  Filled 2019-04-06: qty 250

## 2019-04-06 MED ORDER — LIDOCAINE HCL (PF) 1 % IJ SOLN
INTRAMUSCULAR | Status: DC | PRN
  Administered 2019-04-06 (×2): 4 mL via EPIDURAL

## 2019-04-06 MED ORDER — EPHEDRINE 5 MG/ML INJ: 10.0000 mg | INTRAVENOUS | Status: DC | PRN

## 2019-04-06 MED ORDER — PHENYLEPHRINE 40 MCG/ML (10ML) SYRINGE FOR IV PUSH (FOR BLOOD PRESSURE SUPPORT): 80.0000 ug | PREFILLED_SYRINGE | INTRAVENOUS | Status: DC | PRN

## 2019-04-06 MED ORDER — OXYTOCIN BOLUS FROM INFUSION
500.0000 mL | Freq: Once | INTRAVENOUS | Status: AC
  Administered 2019-04-07: 500 mL via INTRAVENOUS

## 2019-04-06 MED ORDER — SOD CITRATE-CITRIC ACID 500-334 MG/5ML PO SOLN: 30.0000 mL | ORAL | Status: DC | PRN

## 2019-04-06 MED ORDER — ACETAMINOPHEN 325 MG PO TABS: 650.0000 mg | ORAL_TABLET | ORAL | Status: DC | PRN

## 2019-04-06 MED ORDER — DIPHENHYDRAMINE HCL 50 MG/ML IJ SOLN: 12.5000 mg | INTRAMUSCULAR | Status: DC | PRN

## 2019-04-06 MED ORDER — SODIUM CHLORIDE 0.9 % IV SOLN
5.0000 10*6.[IU] | Freq: Once | INTRAVENOUS | Status: AC
  Administered 2019-04-06: 08:00:00 5 10*6.[IU] via INTRAVENOUS
  Filled 2019-04-06: qty 5

## 2019-04-06 MED ORDER — PHENYLEPHRINE 40 MCG/ML (10ML) SYRINGE FOR IV PUSH (FOR BLOOD PRESSURE SUPPORT)
80.0000 ug | PREFILLED_SYRINGE | INTRAVENOUS | Status: DC | PRN
  Filled 2019-04-06: qty 10

## 2019-04-06 MED ORDER — LIDOCAINE HCL (PF) 1 % IJ SOLN: 30.0000 mL | INTRAMUSCULAR | Status: DC | PRN

## 2019-04-06 MED ORDER — LACTATED RINGERS IV SOLN: 500.0000 mL | INTRAVENOUS | Status: DC | PRN

## 2019-04-06 MED ORDER — PENICILLIN G 3 MILLION UNITS IVPB - SIMPLE MED
3.0000 10*6.[IU] | INTRAVENOUS | Status: DC
  Administered 2019-04-06 – 2019-04-07 (×5): 3 10*6.[IU] via INTRAVENOUS
  Filled 2019-04-06 (×5): qty 100

## 2019-04-06 NOTE — Progress Notes (Signed)
LABOR PROGRESS NOTE  Jasmine Bailey is a 23 y.o. G3P1011 at [redacted]w[redacted]d  admitted for SROM.   Subjective: Patient comfortable with epidural. Nauseous when she drinks fluids, otherwise feeling fine.   Objective: BP 116/66   Pulse 62   Temp 97.9 F (36.6 C) (Oral)   Resp 18   Ht 5\' 2"  (1.575 m)   Wt 72.6 kg   LMP  (LMP Unknown)   SpO2 97%   BMI 29.26 kg/m  or  Vitals:   04/06/19 2100 04/06/19 2130 04/06/19 2200 04/06/19 2230  BP: (!) 120/99 131/89 122/87 116/66  Pulse: (!) 108 (!) 110 90 62  Resp:   18   Temp:      TempSrc:      SpO2:      Weight:      Height:        Dilation: 8 Effacement (%): 80 Station: -2 Presentation: Vertex Exam by:: E. Rothermel RN  FHT: baseline rate 150, moderate varibility, +acel, early decel Toco: q3-4 min   Labs: Lab Results  Component Value Date   WBC 7.1 04/06/2019   HGB 9.5 (L) 04/06/2019   HCT 27.4 (L) 04/06/2019   MCV 90.1 04/06/2019   PLT 217 04/06/2019    Patient Active Problem List   Diagnosis Date Noted  . PROM (premature rupture of membranes) 04/06/2019  . GBS (group B Streptococcus carrier), +RV culture, currently pregnant 03/26/2019  . Polyhydramnios 03/24/2019  . Sickle cell trait ([place]) 02/12/2019  . Anemia 12/02/2018  . Late prenatal care in second trimester 11/26/2018  . Supervision of normal pregnancy 11/26/2018  . Shoulder dystocia 02/25/2013  . Eczema 01/17/2013    Assessment / Plan: 23 y.o. G3P1011 at [redacted]w[redacted]d here for SROM.   Labor: Patient on Pitocin at 22 mu/min. Fetal station lower but cervix has not progressed since last exam. IUPC placed to assess MVUs and titrate Pitocin as needed.  Fetal Wellbeing:  Cat I  Pain Control:  Epidural in place  Anticipated MOD:  NSVD   Phill Myron, D.O. OB Fellow  04/06/2019, 11:13 PM

## 2019-04-06 NOTE — Progress Notes (Signed)
Labor Progress Note Charonda Hefter is a 23 y.o. G3P1011 at [redacted]w[redacted]d presented for PROM  S:  Patient feeling intermittent stronger contractions  O:  BP 130/77   Pulse 84   Temp 97.6 F (36.4 C) (Oral)   Resp (P) 16   Ht 5\' 2"  (1.575 m)   Wt 72.6 kg   LMP  (LMP Unknown)   SpO2 99%   BMI 29.26 kg/m   Fetal Tracing:  Baseline: 145 Variability: moderate Accels: 15x15 Decels: none  Toco: q1-2   CVE: Dilation: 5 Effacement (%): 70 Station: -3 Presentation: Vertex Exam by:: s grindstaff rn    A&P: 23 y.o. G3P1011 [redacted]w[redacted]d PROM #Labor: Contracting regularly and unmedicated at this time. Denies feeling any rectal pressure. Will continue pitocin #Pain: per patient request #FWB: Cat 1 #GBS positive  Wende Mott, CNM 4:49 PM

## 2019-04-06 NOTE — Progress Notes (Signed)
Labor Progress Note Jasmine Bailey is a 23 y.o. G3P1011 at [redacted]w[redacted]d presented for SROM  S:  Patient feeling lots of pressure and very uncomfortable with contractions  O:  BP 101/84   Pulse 73   Temp 97.7 F (36.5 C) (Axillary)   Resp 20   Ht 5\' 2"  (1.575 m)   Wt 72.6 kg   LMP  (LMP Unknown)   SpO2 99%   BMI 29.26 kg/m   Fetal Tracing:  Baseline: 140 Variability: moderate Accels: 15x15 Decels: early  Toco: 1-2   CVE: Dilation: 6 Effacement (%): 70 Station: -2 Presentation: Vertex Exam by:: Jasmine Bailey CNM   A&P: 23 y.o. G3P1011 [redacted]w[redacted]d PROM #Labor: Progressing well. Will continue plan of care. Patient to get epidural #Pain: epidural #FWB: Cat 1 #GBS positive  Jasmine Bailey, CNM 6:49 PM

## 2019-04-06 NOTE — H&P (Addendum)
Jasmine Bailey is a 23 y.o. female presenting for gush of fluid at midnight with contractions. .Has received care in [place] and more recently at [facility].  Patient Active Problem List   Diagnosis Date Noted  . GBS (group B Streptococcus carrier), +RV culture, currently pregnant 03/26/2019  . Polyhydramnios 03/24/2019  . Sickle cell trait ([place]) 02/12/2019  . Anemia 12/02/2018  . Late prenatal care in second trimester 11/26/2018  . Supervision of normal pregnancy 11/26/2018  . Shoulder dystocia 02/25/2013  . Eczema 01/17/2013     RN note: Patient presents to MAU c/o ctx and LOF.  Patient reports gush of fluid around 0000.  Denies vag bleeding.  +FM.   OB History    Gravida  3   Para  1   Term  1   Preterm      AB  1   Living  1     SAB      TAB  1   Ectopic      Multiple      Live Births  1          Past Medical History:  Diagnosis Date  . Medical history non-contributory    Past Surgical History:  Procedure Laterality Date  . NO PAST SURGERIES     Family History: family history includes Congestive Heart Failure in her maternal grandfather; Diabetes in an other family member; Hypertension in her father and another family member. Social History:  reports that she quit smoking about 6 years ago. Her smoking use included cigarettes. She smoked 0.00 packs per day for 5.00 years. She has never used smokeless tobacco. She reports previous alcohol use. She reports that she does not use drugs.     Maternal Diabetes: No Genetic Screening: Declined Maternal Ultrasounds/Referrals: Normal Fetal Ultrasounds or other Referrals:  None Maternal Substance Abuse:  No Significant Maternal Medications:  None Significant Maternal Lab Results:  Group B Strep positive Other Comments:  None  Review of Systems  Constitutional: Negative for chills and fever.  Respiratory: Negative for shortness of breath.   Gastrointestinal: Positive for abdominal pain. Negative  for constipation, diarrhea, nausea and vomiting.  Neurological: Negative for weakness.   Maternal Medical History:  Reason for admission: Rupture of membranes and contractions.  Nausea.  Contractions: Onset was 1-2 hours ago.   Frequency: irregular.   Perceived severity is moderate.    Fetal activity: Perceived fetal activity is normal.   Last perceived fetal movement was within the past hour.    Prenatal complications: Polyhydramnios.   No PIH, placental abnormality, pre-eclampsia or preterm labor.   Prenatal Complications - Diabetes: none.    Dilation: 4.5 Effacement (%): 50 Station: -3 Exam by:: Denyse Dago, RN Blood pressure 133/89, pulse 74, temperature 98.4 F (36.9 C), temperature source Oral, resp. rate 18, height 5\' 2"  (1.575 m), SpO2 99 %, unknown if currently breastfeeding. Maternal Exam:  Uterine Assessment: Contraction strength is moderate.  Contraction frequency is irregular.   Abdomen: Patient reports no abdominal tenderness. Fetal presentation: vertex  Introitus: Normal vulva. Normal vagina.  Ferning test: positive.  Nitrazine test: not done. Amniotic fluid character: clear.  Pelvis: adequate for delivery.   Cervix: Cervix evaluated by digital exam.     Fetal Exam Fetal Monitor Review: Mode: ultrasound.   Baseline rate: 140.  Variability: moderate (6-25 bpm).   Pattern: accelerations present and no decelerations.    Fetal State Assessment: Category I - tracings are normal.     Physical Exam  Constitutional: She is oriented to person, place, and time. She appears well-developed and well-nourished. No distress.  Cardiovascular: Normal rate and regular rhythm.  Respiratory: Effort normal. No respiratory distress.  GI: Soft. She exhibits no distension. There is no abdominal tenderness. There is no rebound and no guarding.  Genitourinary:    Vulva normal.     Genitourinary Comments: Dilation: 4.5 Effacement (%): 50 Station: -3 Presentation:  Vertex Exam by:: Suezanne JacquetJ Rambo Sarafian, RN    Musculoskeletal: Normal range of motion.  Neurological: She is alert and oriented to person, place, and time.  Skin: Skin is warm and dry.  Psychiatric: She has a normal mood and affect.    Prenatal labs: ABO, Rh: O/Positive/-- (04/07 1155) Antibody: Negative (05/27 0955) Rubella: 1.51 (04/07 1155) RPR: Non Reactive (05/27 0955)  HBsAg: Negative (04/07 1155)  HIV: Non Reactive (05/27 0955)  GBS: Positive (07/29 0000)   Assessment/Plan: Single intrauterine pregnancy at 694w2d PROM at term Latent phase labor GBS + Polyhydramnios  Admit to Labor and Delivery Routine orders Observe labor, expectant management Anticipate SVD    Jasmine Bailey 04/06/2019, 7:07 AM

## 2019-04-06 NOTE — Anesthesia Preprocedure Evaluation (Signed)
Anesthesia Evaluation  Patient identified by MRN, date of birth, ID band Patient awake    Reviewed: Allergy & Precautions, Patient's Chart, lab work & pertinent test results  Airway Mallampati: II  TM Distance: >3 FB Neck ROM: Full    Dental no notable dental hx. (+) Teeth Intact   Pulmonary neg pulmonary ROS, former smoker,    Pulmonary exam normal breath sounds clear to auscultation       Cardiovascular negative cardio ROS Normal cardiovascular exam Rhythm:Regular Rate:Normal     Neuro/Psych negative neurological ROS  negative psych ROS   GI/Hepatic Neg liver ROS, GERD  ,  Endo/Other  negative endocrine ROS  Renal/GU negative Renal ROS  negative genitourinary   Musculoskeletal negative musculoskeletal ROS (+)   Abdominal   Peds  Hematology  (+) anemia ,   Anesthesia Other Findings   Reproductive/Obstetrics (+) Pregnancy                             Anesthesia Physical Anesthesia Plan  ASA: II  Anesthesia Plan: Epidural   Post-op Pain Management:    Induction:   PONV Risk Score and Plan:   Airway Management Planned: Natural Airway  Additional Equipment:   Intra-op Plan:   Post-operative Plan:   Informed Consent: I have reviewed the patients History and Physical, chart, labs and discussed the procedure including the risks, benefits and alternatives for the proposed anesthesia with the patient or authorized representative who has indicated his/her understanding and acceptance.       Plan Discussed with: Anesthesiologist  Anesthesia Plan Comments:         Anesthesia Quick Evaluation

## 2019-04-06 NOTE — MAU Note (Signed)
Patient presents to MAU c/o ctx and LOF.  Patient reports gush of fluid around 0000.  Denies vag bleeding.  +FM.

## 2019-04-06 NOTE — Progress Notes (Signed)
Labor Progress Note Raeanne Deschler is a 23 y.o. G3P1011 at [redacted]w[redacted]d presented for PROM   S:  Patient sleeping  O:  BP (!) 105/54   Pulse 82   Temp 98.6 F (37 C) (Oral)   Resp 20   Ht 5\' 2"  (1.575 m)   Wt 72.6 kg   LMP  (LMP Unknown)   SpO2 99%   BMI 29.26 kg/m   Fetal Tracing:  Baseline: 150 Variability: moderate Accels: 15x15 Decels: none  Toco: 3-5   CVE: Dilation: 4.5 Effacement (%): 50 Station: -3 Presentation: Vertex Exam by:: Denyse Dago, RN   A&P: 23 y.o. G3P1011 [redacted]w[redacted]d PROM #Labor: Patient not uncomfortable and contractions spacing out since expectant management started. Plan to start pitocin 2x2 to augment labor #Pain: per patient request #FWB: Cat 1 #GBS positive  Wende Mott, CNM 8:52 AM

## 2019-04-06 NOTE — Anesthesia Procedure Notes (Signed)
Epidural Patient location during procedure: OB Start time: 04/06/2019 6:17 PM End time: 04/06/2019 6:24 PM  Staffing Anesthesiologist: Josephine Igo, MD Performed: anesthesiologist   Preanesthetic Checklist Completed: patient identified, site marked, surgical consent, pre-op evaluation, timeout performed, IV checked, risks and benefits discussed and monitors and equipment checked  Epidural Patient position: sitting Prep: site prepped and draped and DuraPrep Patient monitoring: continuous pulse ox and blood pressure Approach: midline Location: L3-L4 Injection technique: LOR air  Needle:  Needle type: Tuohy  Needle gauge: 17 G Needle length: 9 cm and 9 Needle insertion depth: 7 cm Catheter type: closed end flexible Catheter size: 19 Gauge Catheter at skin depth: 12 cm Test dose: negative and Other  Assessment Events: blood not aspirated, injection not painful, no injection resistance, negative IV test and no paresthesia  Additional Notes Patient identified. Risks and benefits discussed including failed block, incomplete  Pain control, post dural puncture headache, nerve damage, paralysis, blood pressure Changes, nausea, vomiting, reactions to medications-both toxic and allergic and post Partum back pain. All questions were answered. Patient expressed understanding and wished to proceed. Sterile technique was used throughout procedure. Epidural site was Dressed with sterile barrier dressing. No paresthesias, signs of intravascular injection Or signs of intrathecal spread were encountered.  Patient was more comfortable after the epidural was dosed. Please see RN's note for documentation of vital signs and FHR which are stable. Reason for block:procedure for pain

## 2019-04-07 ENCOUNTER — Encounter (HOSPITAL_COMMUNITY): Payer: Self-pay | Admitting: *Deleted

## 2019-04-07 DIAGNOSIS — Z3A39 39 weeks gestation of pregnancy: Secondary | ICD-10-CM

## 2019-04-07 DIAGNOSIS — O403XX0 Polyhydramnios, third trimester, not applicable or unspecified: Secondary | ICD-10-CM

## 2019-04-07 DIAGNOSIS — O4202 Full-term premature rupture of membranes, onset of labor within 24 hours of rupture: Secondary | ICD-10-CM

## 2019-04-07 DIAGNOSIS — O99824 Streptococcus B carrier state complicating childbirth: Secondary | ICD-10-CM

## 2019-04-07 MED ORDER — WITCH HAZEL-GLYCERIN EX PADS: 1.0000 "application " | MEDICATED_PAD | CUTANEOUS | Status: DC | PRN

## 2019-04-07 MED ORDER — DIPHENHYDRAMINE HCL 25 MG PO CAPS: 25.0000 mg | ORAL_CAPSULE | Freq: Four times a day (QID) | ORAL | Status: DC | PRN

## 2019-04-07 MED ORDER — DIBUCAINE (PERIANAL) 1 % EX OINT: 1.0000 "application " | TOPICAL_OINTMENT | CUTANEOUS | Status: DC | PRN

## 2019-04-07 MED ORDER — TETANUS-DIPHTH-ACELL PERTUSSIS 5-2.5-18.5 LF-MCG/0.5 IM SUSP: 0.5000 mL | Freq: Once | INTRAMUSCULAR | Status: DC

## 2019-04-07 MED ORDER — SENNOSIDES-DOCUSATE SODIUM 8.6-50 MG PO TABS
2.0000 | ORAL_TABLET | ORAL | Status: DC
  Administered 2019-04-07: 23:00:00 2 via ORAL
  Filled 2019-04-07: qty 2

## 2019-04-07 MED ORDER — ACETAMINOPHEN 325 MG PO TABS
650.0000 mg | ORAL_TABLET | ORAL | Status: DC | PRN
  Administered 2019-04-07: 22:00:00 650 mg via ORAL
  Filled 2019-04-07: qty 2

## 2019-04-07 MED ORDER — ONDANSETRON HCL 4 MG PO TABS: 4.0000 mg | ORAL_TABLET | ORAL | Status: DC | PRN

## 2019-04-07 MED ORDER — SIMETHICONE 80 MG PO CHEW: 80.0000 mg | CHEWABLE_TABLET | ORAL | Status: DC | PRN

## 2019-04-07 MED ORDER — ONDANSETRON HCL 4 MG/2ML IJ SOLN: 4.0000 mg | INTRAMUSCULAR | Status: DC | PRN

## 2019-04-07 MED ORDER — COCONUT OIL OIL: 1.0000 "application " | TOPICAL_OIL | Status: DC | PRN

## 2019-04-07 MED ORDER — BENZOCAINE-MENTHOL 20-0.5 % EX AERO: 1.0000 "application " | INHALATION_SPRAY | CUTANEOUS | Status: DC | PRN

## 2019-04-07 MED ORDER — IBUPROFEN 600 MG PO TABS
600.0000 mg | ORAL_TABLET | Freq: Four times a day (QID) | ORAL | Status: DC
  Administered 2019-04-07 – 2019-04-08 (×5): 600 mg via ORAL
  Filled 2019-04-07 (×5): qty 1

## 2019-04-07 MED ORDER — ZOLPIDEM TARTRATE 5 MG PO TABS: 5.0000 mg | ORAL_TABLET | Freq: Every evening | ORAL | Status: DC | PRN

## 2019-04-07 MED ORDER — PRENATAL MULTIVITAMIN CH
1.0000 | ORAL_TABLET | Freq: Every day | ORAL | Status: DC
  Administered 2019-04-07 – 2019-04-08 (×2): 1 via ORAL
  Filled 2019-04-07 (×2): qty 1

## 2019-04-07 MED ORDER — MEASLES, MUMPS & RUBELLA VAC IJ SOLR: 0.5000 mL | Freq: Once | INTRAMUSCULAR | Status: DC

## 2019-04-07 NOTE — Progress Notes (Signed)
LABOR PROGRESS NOTE  Jasmine Bailey is a 23 y.o. G3P1011 at [redacted]w[redacted]d  admitted for SROM.   Subjective: Strip note.    Objective: BP 116/61   Pulse (!) 111   Temp 98.7 F (37.1 C) (Oral)   Resp 18   Ht 5\' 2"  (1.575 m)   Wt 72.6 kg   LMP  (LMP Unknown)   SpO2 97%   BMI 29.26 kg/m  or  Vitals:   04/07/19 0300 04/07/19 0330 04/07/19 0400 04/07/19 0430  BP: 117/63 (!) 119/57 116/62 116/61  Pulse: 95 88 93 (!) 111  Resp: 18 18  18   Temp: 98.7 F (37.1 C)     TempSrc: Oral     SpO2:      Weight:      Height:        Dilation: Lip/rim Effacement (%): 100 Cervical Position: Middle Station: -1, 0 Presentation: Vertex Exam by:: E. Rothermel RN  FHT: baseline rate 145, moderate varibility, +acel, early decel Toco: q2-4 min   Labs: Lab Results  Component Value Date   WBC 7.1 04/06/2019   HGB 9.5 (L) 04/06/2019   HCT 27.4 (L) 04/06/2019   MCV 90.1 04/06/2019   PLT 217 04/06/2019    Patient Active Problem List   Diagnosis Date Noted  . PROM (premature rupture of membranes) 04/06/2019  . GBS (group B Streptococcus carrier), +RV culture, currently pregnant 03/26/2019  . Polyhydramnios 03/24/2019  . Sickle cell trait ([place]) 02/12/2019  . Anemia 12/02/2018  . Late prenatal care in second trimester 11/26/2018  . Supervision of normal pregnancy 11/26/2018  . Shoulder dystocia 02/25/2013  . Eczema 01/17/2013    Assessment / Plan: 23 y.o. G3P1011 at [redacted]w[redacted]d here for SROM.   Labor: Patient on Pitocin at 26 mu/min. IUPC shows adequate MVUs >200. Has made good cervical change.  Fetal Wellbeing:  Cat I  Pain Control:  Epidural in place  Anticipated MOD:  NSVD   Phill Myron, D.O. OB Fellow  04/07/2019, 4:46 AM

## 2019-04-07 NOTE — Lactation Note (Signed)
This note was copied from a baby's chart. Lactation Consultation Note: [place] visit with mother and mother quickly reports that she changed her mind about breastfeeding.   Patient Name: Jasmine Bailey DTOIZ'T Date: 04/07/2019     Maternal Data    Feeding    LATCH Score                   Interventions    Lactation Tools Discussed/Used     Consult Status      Jess Barters [facility] 04/07/2019, 4:04 PM

## 2019-04-07 NOTE — Discharge Summary (Signed)
OB Discharge Summary     Patient Name: Jasmine KitchensKeyona Nakatani DOB: 02/11/1996 MRN: [id number]  Date of admission: 04/06/2019 Delivering MD: Arvilla MarketWALLACE, Mercedes Fort LAUREN   Date of discharge: 04/08/2019  Admitting diagnosis: 39WKS, CTX, LEAKING FLUIDS Intrauterine pregnancy: 6373w3d     Secondary diagnosis:  Active Problems:   Late prenatal care in second trimester   Sickle cell trait ([facility])   PROM (premature rupture of membranes)  Additional problems: None      Discharge diagnosis: Term Pregnancy Delivered                                                                                                Post partum procedures:None  Augmentation: Pitocin  Complications: None  Hospital course:  Onset of Labor With Vaginal Delivery     23 y.o. yo G3P1011 at 4073w3d was admitted in Latent Labor on 04/06/2019. Patient had an uncomplicated labor course as follows:  Membrane Rupture Time/Date: 12:00 AM ,04/06/2019   Intrapartum Procedures: Episiotomy: None [1]                                         Lacerations:  None [1]  Patient had a delivery of a Viable infant. 04/07/2019  Information for the patient's newborn:  Eddie NorthJohnson, Boy Yoshika [[id number]][[id number]]  Delivery Method: Vag-Spont     Pateint had an uncomplicated postpartum course.  She is ambulating, tolerating a regular diet, passing flatus, and urinating well. Patient is discharged home in stable condition on 04/08/19.   Physical exam  Vitals:   04/07/19 1330 04/07/19 1728 04/07/19 2143 04/08/19 0547  BP: 123/81 114/64 113/62 107/68  Pulse: 67 71 84 (!) 56  Resp:  18 18 18   Temp: 98.2 F (36.8 C) 97.8 F (36.6 C)  97.9 F (36.6 C)  TempSrc: Oral Oral Oral Oral  SpO2:  98% 99% 100%  Weight:      Height:       General: alert and cooperative Lochia: appropriate Uterine Fundus: firm Incision: N/A DVT Evaluation: No evidence of DVT seen on physical exam. Labs: Lab Results  Component Value Date   WBC 7.1 04/06/2019   HGB 9.5 (L) 04/06/2019    HCT 27.4 (L) 04/06/2019   MCV 90.1 04/06/2019   PLT 217 04/06/2019   CMP Latest Ref Rng & Units 06/24/2013  Glucose 70 - 99 mg/dL 782(N102(H)  BUN 6 - 23 mg/dL 6  Creatinine 5.620.50 - 1.301.10 mg/dL 8.650.69  Sodium 784135 - 696145 mEq/L 133(L)  Potassium 3.5 - 5.1 mEq/L 3.3(L)  Chloride 96 - 112 mEq/L 100  CO2 19 - 32 mEq/L 24  Calcium 8.4 - 10.5 mg/dL 8.6  Total Protein 6.0 - 8.3 g/dL -  Total Bilirubin 0.3 - 1.2 mg/dL -  Alkaline Phos 39 - 295117 U/L -  AST 0 - 37 U/L -  ALT 0 - 35 U/L -    Discharge instruction: per After Visit Summary and "Baby and Me Booklet".  After visit meds:  Allergies as of 04/08/2019  No Known Allergies     Medication List    STOP taking these medications   cromolyn 5.2 MG/ACT nasal spray Commonly known as: NASALCROM     TAKE these medications   ferrous sulfate 325 (65 FE) MG tablet Take 1 tablet (325 mg total) by mouth 2 (two) times daily with a meal.   ibuprofen 600 MG tablet Commonly known as: ADVIL Take 1 tablet (600 mg total) by mouth every 6 (six) hours.   PRENATAL GUMMIES/DHA & FA PO Take by mouth.   senna-docusate 8.6-50 MG tablet Commonly known as: Senokot-S Take 2 tablets by mouth daily. Start taking on: April 09, 2019       Diet: routine diet  Activity: Advance as tolerated. Pelvic rest for 6 weeks.   Outpatient follow up:4 weeks Follow up Appt: Future Appointments  Date Time Provider [place]  05/12/2019  1:30 PM Roma Schanz, CNM [facility]   Follow up Visit:No follow-ups on file.  Postpartum contraception: Depo Provera  Newborn Data: Live born female  Birth Weight:  3660g  APGAR: 77, 9   Newborn Delivery   Birth date/time: 04/07/2019 05:47:00 Delivery type: Vaginal, Spontaneous      Baby Feeding: Bottle Disposition:home with mother   04/08/2019 Melina Schools, DO

## 2019-04-07 NOTE — Anesthesia Postprocedure Evaluation (Signed)
Anesthesia Post Note  Patient: Jasmine Bailey  Procedure(s) Performed: AN AD HOC LABOR EPIDURAL     Patient location during evaluation: Mother Baby Anesthesia Type: Epidural Level of consciousness: awake and alert Pain management: pain level controlled Vital Signs Assessment: post-procedure vital signs reviewed and stable Respiratory status: spontaneous breathing, nonlabored ventilation and respiratory function stable Cardiovascular status: stable Postop Assessment: no headache, no backache and epidural receding Anesthetic complications: no    Last Vitals:  Vitals:   04/07/19 0753 04/07/19 0905  BP: 130/82 117/68  Pulse: 63 69  Resp: 17 17  Temp: 37.2 C 36.7 C  SpO2:  99%    Last Pain:  Vitals:   04/07/19 1146  TempSrc:   PainSc: 2    Pain Goal:                   Jasmine Bailey

## 2019-04-07 NOTE — Progress Notes (Signed)
Patient nauseous w/ vomiting x3 episodes. Anti-emetic medication offered; patient refused at this time.

## 2019-04-08 MED ORDER — IBUPROFEN 600 MG PO TABS: 600.0000 mg | ORAL_TABLET | Freq: Four times a day (QID) | ORAL | 1 refills | Status: DC

## 2019-04-08 MED ORDER — SENNOSIDES-DOCUSATE SODIUM 8.6-50 MG PO TABS: 2.0000 | ORAL_TABLET | ORAL | 0 refills | Status: DC

## 2019-04-08 NOTE — Discharge Instructions (Signed)

## 2019-04-14 ENCOUNTER — Encounter (HOSPITAL_COMMUNITY): Payer: Self-pay | Admitting: Emergency Medicine

## 2019-04-14 ENCOUNTER — Ambulatory Visit (INDEPENDENT_AMBULATORY_CARE_PROVIDER_SITE_OTHER): Payer: 59 | Admitting: Obstetrics and Gynecology

## 2019-04-14 ENCOUNTER — Encounter: Payer: Self-pay | Admitting: Obstetrics and Gynecology

## 2019-04-14 ENCOUNTER — Other Ambulatory Visit: Payer: Self-pay

## 2019-04-14 VITALS — BP 180/100 | HR 56 | Ht 62.0 in | Wt 140.2 lb

## 2019-04-14 DIAGNOSIS — I10 Essential (primary) hypertension: Secondary | ICD-10-CM | POA: Insufficient documentation

## 2019-04-14 DIAGNOSIS — O165 Unspecified maternal hypertension, complicating the puerperium: Secondary | ICD-10-CM

## 2019-04-14 DIAGNOSIS — O1415 Severe pre-eclampsia, complicating the puerperium: Secondary | ICD-10-CM | POA: Diagnosis not present

## 2019-04-14 DIAGNOSIS — Z9889 Other specified postprocedural states: Secondary | ICD-10-CM

## 2019-04-14 DIAGNOSIS — R030 Elevated blood-pressure reading, without diagnosis of hypertension: Secondary | ICD-10-CM | POA: Diagnosis not present

## 2019-04-14 DIAGNOSIS — Z5321 Procedure and treatment not carried out due to patient leaving prior to being seen by health care provider: Secondary | ICD-10-CM | POA: Insufficient documentation

## 2019-04-14 NOTE — ED Triage Notes (Signed)
Pt gave birth on 04/07/2019. Pt states she was seen by OB this morning for high BP and was told to take her BP X 3 at home. Pt BP in triage 174/109. Denies pain.

## 2019-04-14 NOTE — Progress Notes (Signed)
Patient ID: Jasmine Bailey, female   DOB: 14-Nov-1995, 23 y.o.   MRN: [id number]    [facility] [place] [facility] Clinic Visit  @DATE @            Patient name: Jasmine Bailey MRN [id number]  Date of birth: [birth year]  CC & HPI:  Jasmine Bailey is a 23 y.o. female presenting today for wants to discuss birth control and wants to try Nuvaring again. Recently delivered a week ago. Still needs to have her PP visit. Is not currently breastfeeding. Is here with son for circ. Denies any headaches and blurry vision. Has had decreased in appetite.  ROS:  ROS  +decreased appetite +pp htn -blurry vision -headache  Pertinent History Reviewed:   Reviewed:  Medical         Past Medical History:  Diagnosis Date  . Medical history non-contributory                               Surgical Hx:    Past Surgical History:  Procedure Laterality Date  . NO PAST SURGERIES     Medications: Reviewed & Updated - see associated section                       Current Outpatient Medications:  .  ferrous sulfate 325 (65 FE) MG tablet, Take 1 tablet (325 mg total) by mouth 2 (two) times daily with a meal., Disp: 60 tablet, Rfl: 3 .  ibuprofen (ADVIL) 600 MG tablet, Take 1 tablet (600 mg total) by mouth every 6 (six) hours., Disp: 30 tablet, Rfl: 1 .  Prenatal MV-Min-FA-Omega-3 (PRENATAL GUMMIES/DHA & FA PO), Take by mouth., Disp: , Rfl:  .  senna-docusate (SENOKOT-S) 8.6-50 MG tablet, Take 2 tablets by mouth daily., Disp: 20 tablet, Rfl: 0   Social History: Reviewed -  reports that she quit smoking about 6 years ago. Her smoking use included cigarettes. She smoked 0.00 packs per day for 5.00 years. She has never used smokeless tobacco.  Objective Findings:  Vitals: Blood pressure (!) 160/92, pulse (!) 52, height 5\' 2"  (1.575 m), weight 140 lb 3.2 oz (63.6 kg), unknown if currently breastfeeding.  PHYSICAL EXAMINATION General appearance - alert, well appearing, and in no distress, anxious, not able to watch circ. Mental status  - alert, oriented to person, place, and time, normal mood, behavior, speech, dress, motor activity, and thought processes, affect appropriate to mood  PELVIC Deferred  Assessment & Plan:   A:  1.  Contraception management 2. Postpartum HTN felt due to anxiety of circ.  P:  1. Nuvaring 2. Begin in 3 weeks 3. CBC 4. CMET 5 u/a protein/ CR ratio 6 recheck bp in office in a.m. pt to check bp x 3 today at home and call  us by 4 pm with results.   By signing my name below, I, Zeniya Lapidus, attest that this documentation has been prepared under the direction and in the presence of Jonnie Kind, MD. Electronically Signed: [place]. 04/14/19. 11:13 AM.  I personally performed the services described in this documentation, which was SCRIBED in my presence. The recorded information has been reviewed and considered accurate. It has been edited as necessary during review. Jonnie Kind, MD

## 2019-04-15 ENCOUNTER — Encounter (HOSPITAL_COMMUNITY): Payer: Self-pay

## 2019-04-15 ENCOUNTER — Emergency Department (HOSPITAL_COMMUNITY)
Admission: EM | Admit: 2019-04-15 | Discharge: 2019-04-15 | Disposition: A | Discharge status: Home / Self Care | Payer: No Typology Code available for payment source | Source: Home / Self Care

## 2019-04-15 ENCOUNTER — Telehealth: Payer: Self-pay | Admitting: Obstetrics and Gynecology

## 2019-04-15 ENCOUNTER — Inpatient Hospital Stay (HOSPITAL_COMMUNITY)
Admission: AD | Admit: 2019-04-15 | Discharge: 2019-04-18 | DRG: 776 | Disposition: A | Discharge status: Home / Self Care | Payer: No Typology Code available for payment source | Attending: Family Medicine | Admitting: Family Medicine

## 2019-04-15 DIAGNOSIS — Z3483 Encounter for supervision of other normal pregnancy, third trimester: Secondary | ICD-10-CM

## 2019-04-15 DIAGNOSIS — Z20828 Contact with and (suspected) exposure to other viral communicable diseases: Secondary | ICD-10-CM | POA: Diagnosis present

## 2019-04-15 DIAGNOSIS — Z87891 Personal history of nicotine dependence: Secondary | ICD-10-CM | POA: Diagnosis not present

## 2019-04-15 DIAGNOSIS — O1415 Severe pre-eclampsia, complicating the puerperium: Principal | ICD-10-CM | POA: Diagnosis present

## 2019-04-15 DIAGNOSIS — R030 Elevated blood-pressure reading, without diagnosis of hypertension: Secondary | ICD-10-CM | POA: Diagnosis present

## 2019-04-15 DIAGNOSIS — O0932 Supervision of pregnancy with insufficient antenatal care, second trimester: Secondary | ICD-10-CM

## 2019-04-15 DIAGNOSIS — O1495 Unspecified pre-eclampsia, complicating the puerperium: Secondary | ICD-10-CM

## 2019-04-15 LAB — COMPREHENSIVE METABOLIC PANEL
ALT: 36 IU/L — ABNORMAL HIGH (ref 0–32)
ALT: 31 U/L (ref 0–44)
AST: 25 IU/L (ref 0–40)
AST: 24 U/L (ref 15–41)
Albumin/Globulin Ratio: 1.5 (ref 1.2–2.2)
Albumin: 3.7 g/dL — ABNORMAL LOW (ref 3.9–5.0)
Albumin: 3.1 g/dL — ABNORMAL LOW (ref 3.5–5.0)
Alkaline Phosphatase: 134 IU/L — ABNORMAL HIGH (ref 39–117)
Alkaline Phosphatase: 108 U/L (ref 38–126)
Anion gap: 10 (ref 5–15)
BUN/Creatinine Ratio: 9 (ref 9–23)
BUN: 6 mg/dL (ref 6–20)
BUN: 6 mg/dL (ref 6–20)
Bilirubin Total: 0.7 mg/dL (ref 0.0–1.2)
CO2: 22 mmol/L (ref 20–29)
CO2: 24 mmol/L (ref 22–32)
Calcium: 8.7 mg/dL (ref 8.7–10.2)
Calcium: 8.6 mg/dL — ABNORMAL LOW (ref 8.9–10.3)
Chloride: 106 mmol/L (ref 96–106)
Chloride: 103 mmol/L (ref 98–111)
Creatinine, Ser: 0.64 mg/dL (ref 0.57–1.00)
Creatinine, Ser: 0.72 mg/dL (ref 0.44–1.00)
GFR calc Af Amer: 143 mL/min/{1.73_m2} (ref >59)
GFR calc Af Amer: >60 mL/min (ref >60)
GFR calc non Af Amer: 124 mL/min/{1.73_m2} (ref >59)
GFR calc non Af Amer: >60 mL/min (ref >60)
Globulin, Total: 2.5 g/dL (ref 1.5–4.5)
Glucose, Bld: 93 mg/dL (ref 70–99)
Glucose: 78 mg/dL (ref 65–99)
Potassium: 4.3 mmol/L (ref 3.5–5.2)
Potassium: 3.2 mmol/L — ABNORMAL LOW (ref 3.5–5.1)
Sodium: 142 mmol/L (ref 134–144)
Sodium: 137 mmol/L (ref 135–145)
Total Bilirubin: 0.7 mg/dL (ref 0.3–1.2)
Total Protein: 6.2 g/dL (ref 6.0–8.5)
Total Protein: 6.2 g/dL — ABNORMAL LOW (ref 6.5–8.1)

## 2019-04-15 LAB — URINALYSIS, ROUTINE W REFLEX MICROSCOPIC
Bilirubin Urine: NEGATIVE
Glucose, UA: NEGATIVE mg/dL
Ketones, ur: NEGATIVE mg/dL
Nitrite: NEGATIVE
Protein, ur: NEGATIVE mg/dL
Specific Gravity, Urine: 1.004 — ABNORMAL LOW (ref 1.005–1.030)
pH: 6 (ref 5.0–8.0)

## 2019-04-15 LAB — CBC
HCT: 29.3 % — ABNORMAL LOW (ref 36.0–46.0)
Hematocrit: 31 % — ABNORMAL LOW (ref 34.0–46.6)
Hemoglobin: 10.8 g/dL — ABNORMAL LOW (ref 11.1–15.9)
Hemoglobin: 10.6 g/dL — ABNORMAL LOW (ref 12.0–15.0)
MCH: 30.7 pg (ref 26.6–33.0)
MCH: 30.8 pg (ref 26.0–34.0)
MCHC: 34.8 g/dL (ref 31.5–35.7)
MCHC: 36.2 g/dL — ABNORMAL HIGH (ref 30.0–36.0)
MCV: 88 fL (ref 79–97)
MCV: 85.2 fL (ref 80.0–100.0)
Platelets: 318 10*3/uL (ref 150–450)
Platelets: 326 10*3/uL (ref 150–400)
RBC: 3.52 x10E6/uL — ABNORMAL LOW (ref 3.77–5.28)
RBC: 3.44 MIL/uL — ABNORMAL LOW (ref 3.87–5.11)
RDW: 13.5 % (ref 11.7–15.4)
RDW: 12.6 % (ref 11.5–15.5)
WBC: 6 10*3/uL (ref 3.4–10.8)
WBC: 4.9 10*3/uL (ref 4.0–10.5)
nRBC: 0 % (ref 0.0–0.2)

## 2019-04-15 LAB — PROTEIN / CREATININE RATIO, URINE
Creatinine, Urine: 58.9 mg/dL
Creatinine, Urine: 58.5 mg/dL
Protein, Ur: 27.9 mg/dL
Protein/Creat Ratio: 474 mg/g creat — ABNORMAL HIGH (ref 0–200)
Total Protein, Urine: <10 mg/dL

## 2019-04-15 MED ORDER — DOCUSATE SODIUM 100 MG PO CAPS
100.0000 mg | ORAL_CAPSULE | Freq: Every day | ORAL | Status: DC
  Administered 2019-04-16 – 2019-04-18 (×2): 100 mg via ORAL
  Filled 2019-04-15 (×3): qty 1

## 2019-04-15 MED ORDER — LABETALOL HCL 5 MG/ML IV SOLN
80.0000 mg | INTRAVENOUS | Status: DC | PRN
  Administered 2019-04-17: 13:00:00 80 mg via INTRAVENOUS
  Filled 2019-04-15 (×2): qty 16

## 2019-04-15 MED ORDER — LABETALOL HCL 5 MG/ML IV SOLN
40.0000 mg | INTRAVENOUS | Status: DC | PRN
  Administered 2019-04-15 – 2019-04-17 (×2): 40 mg via INTRAVENOUS
  Filled 2019-04-15 (×3): qty 8

## 2019-04-15 MED ORDER — MAGNESIUM SULFATE 40 G IN LACTATED RINGERS - SIMPLE
2.0000 g/h | INTRAVENOUS | Status: DC
  Filled 2019-04-15: qty 500

## 2019-04-15 MED ORDER — LACTATED RINGERS IV SOLN
INTRAVENOUS | Status: DC
  Administered 2019-04-15 – 2019-04-16 (×3): via INTRAVENOUS

## 2019-04-15 MED ORDER — HYDRALAZINE HCL 20 MG/ML IJ SOLN
10.0000 mg | INTRAMUSCULAR | Status: DC | PRN
  Administered 2019-04-17: 15:00:00 10 mg via INTRAVENOUS
  Filled 2019-04-15 (×3): qty 0.5

## 2019-04-15 MED ORDER — CALCIUM CARBONATE ANTACID 500 MG PO CHEW: 2.0000 | CHEWABLE_TABLET | ORAL | Status: DC | PRN

## 2019-04-15 MED ORDER — MAGNESIUM SULFATE BOLUS VIA INFUSION
4.0000 g | Freq: Once | INTRAVENOUS | Status: AC
  Administered 2019-04-15: 21:00:00 4 g via INTRAVENOUS
  Filled 2019-04-15: qty 500

## 2019-04-15 MED ORDER — FERROUS SULFATE 325 (65 FE) MG PO TABS
325.0000 mg | ORAL_TABLET | Freq: Two times a day (BID) | ORAL | Status: DC
  Administered 2019-04-16 – 2019-04-17 (×3): 325 mg via ORAL
  Filled 2019-04-15 (×5): qty 1

## 2019-04-15 MED ORDER — ACETAMINOPHEN 325 MG PO TABS
650.0000 mg | ORAL_TABLET | ORAL | Status: DC | PRN
  Administered 2019-04-16 (×3): 650 mg via ORAL
  Filled 2019-04-15 (×3): qty 2

## 2019-04-15 MED ORDER — LABETALOL HCL 200 MG PO TABS: 400.0000 mg | ORAL_TABLET | Freq: Two times a day (BID) | ORAL | Status: DC

## 2019-04-15 MED ORDER — ZOLPIDEM TARTRATE 5 MG PO TABS: 5.0000 mg | ORAL_TABLET | Freq: Every evening | ORAL | Status: DC | PRN

## 2019-04-15 MED ORDER — MAGNESIUM SULFATE 40 G IN LACTATED RINGERS - SIMPLE
2.0000 g/h | INTRAVENOUS | Status: AC
  Administered 2019-04-16: 15:00:00 2 g/h via INTRAVENOUS

## 2019-04-15 MED ORDER — LABETALOL HCL 5 MG/ML IV SOLN
20.0000 mg | INTRAVENOUS | Status: DC | PRN
  Administered 2019-04-15 – 2019-04-17 (×2): 20 mg via INTRAVENOUS
  Filled 2019-04-15 (×2): qty 4

## 2019-04-15 MED ORDER — LABETALOL HCL 200 MG PO TABS: 400.0000 mg | ORAL_TABLET | Freq: Two times a day (BID) | ORAL | 3 refills | Status: DC

## 2019-04-15 MED ORDER — AMLODIPINE BESYLATE 10 MG PO TABS
10.0000 mg | ORAL_TABLET | Freq: Once | ORAL | Status: AC
  Administered 2019-04-15: 19:00:00 10 mg via ORAL
  Filled 2019-04-15: qty 1

## 2019-04-15 MED ORDER — PRENATAL MULTIVITAMIN CH
1.0000 | ORAL_TABLET | Freq: Every day | ORAL | Status: DC
  Administered 2019-04-16 – 2019-04-17 (×2): 1 via ORAL
  Filled 2019-04-15 (×2): qty 1

## 2019-04-15 NOTE — Telephone Encounter (Signed)
Phone call to pt. Urine Pr/Cr from yesterday comes back elevated at 0.44 Platelets good at 300k Pt willing now to be evaluated at MAU. She took a dose Labetalol at noon.(1st dose.) Will go to MAU now for serial bp's and recheck Pr/Cr.  Pt aware that she MAY need admission for MGSO4.

## 2019-04-15 NOTE — MAU Provider Note (Signed)
History     CSN: [id number]  Arrival date and time: 04/15/19 1744   First Provider Initiated Contact with Patient 04/15/19 1848      Chief Complaint  Patient presents with  . Hypertension   Timmi Devora is a 23 y.o. [id number] who 8 days S/P NSVD. She was at her OB office yesterday for baby's circumcision. Blood pressure was elevated. Dr. Glo Herring started her on labetalol and advised her to be seen. She was not seen yesterday. She took one dose of labetalol today. 400mg  today. She denies any HA, visual disturbance or RUQ pain. She states that she had a headache yesterday, but it is better now. She did not have any blood pressure problems during pregnancy and did not get Mag while she was here in the hospital.    OB History    Gravida  3   Para  2   Term  2   Preterm      AB  1   Living  2     SAB      TAB  1   Ectopic      Multiple  0   Live Births  2           Past Medical History:  Diagnosis Date  . Medical history non-contributory     Past Surgical History:  Procedure Laterality Date  . NO PAST SURGERIES      Family History  Problem Relation Age of Onset  . Hypertension Other   . Diabetes Other   . Congestive Heart Failure Maternal Grandfather   . Hypertension Father     Social History   Tobacco Use  . Smoking status: Former Smoker    Packs/day: 0.00    Years: 5.00    Pack years: 0.00    Types: Cigarettes    Quit date: 08/27/2012    Years since quitting: 6.6  . Smokeless tobacco: Never Used  Substance Use Topics  . Alcohol use: Not Currently    Comment: occasionally   . 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     Allergies: No Known Allergies  Medications Prior to Admission  Medication Sig Dispense Refill Last Dose  . labetalol (NORMODYNE) 200 MG tablet Take 2 tablets (400 mg total) by mouth 2 (two) times daily. 60 tablet 3 04/15/2019 at Unknown time  . Prenatal MV-Min-FA-Omega-3 (PRENATAL GUMMIES/DHA &  FA PO) Take by mouth.   04/14/2019 at Unknown time  . ferrous sulfate 325 (65 FE) MG tablet Take 1 tablet (325 mg total) by mouth 2 (two) times daily with a meal. 60 tablet 3 Unknown at Unknown time  . ibuprofen (ADVIL) 600 MG tablet Take 1 tablet (600 mg total) by mouth every 6 (six) hours. 30 tablet 1 Unknown at Unknown time  . senna-docusate (SENOKOT-S) 8.6-50 MG tablet Take 2 tablets by mouth daily. 20 tablet 0     Review of Systems  Constitutional: Negative for chills and fever.  Eyes: Negative for visual disturbance.  Gastrointestinal: Negative for abdominal pain.  Neurological: Negative for headaches.   Physical Exam   Blood pressure (!) 144/83, pulse 64, temperature 98.4 F (36.9 C), resp. rate 18, height 5\' 3"  (1.6 m), weight 62.9 kg, SpO2 100 %, not currently breastfeeding.  Physical Exam  Nursing note and vitals reviewed. Constitutional: She is oriented to person, place, and time. She appears well-developed and well-nourished. No distress.  HENT:  Head: Normocephalic.  Cardiovascular: Normal  rate.  Respiratory: Effort normal.  GI: Soft. There is no abdominal tenderness. There is no rebound.  Neurological: She is alert and oriented to person, place, and time.  Skin: Skin is warm and dry.  Psychiatric: She has a normal mood and affect.   Results for orders placed or performed during the hospital encounter of 04/15/19 (from the past 24 hour(s))  Urinalysis, Routine w reflex microscopic     Status: Abnormal   Collection Time: 04/15/19  6:31 PM  Result Value Ref Range   Color, Urine STRAW (A) YELLOW   APPearance CLEAR CLEAR   Specific Gravity, Urine 1.004 (L) 1.005 - 1.030   pH 6.0 5.0 - 8.0   Glucose, UA NEGATIVE NEGATIVE mg/dL   Hgb urine dipstick MODERATE (A) NEGATIVE   Bilirubin Urine NEGATIVE NEGATIVE   Ketones, ur NEGATIVE NEGATIVE mg/dL   Protein, ur NEGATIVE NEGATIVE mg/dL   Nitrite NEGATIVE NEGATIVE   Leukocytes,Ua TRACE (A) NEGATIVE   RBC / HPF 0-5 0 - 5  RBC/hpf   WBC, UA 0-5 0 - 5 WBC/hpf   Bacteria, UA RARE (A) NONE SEEN   Squamous Epithelial / LPF 0-5 0 - 5   Mucus PRESENT   CBC     Status: Abnormal   Collection Time: 04/15/19  6:40 PM  Result Value Ref Range   WBC 4.9 4.0 - 10.5 K/uL   RBC 3.44 (L) 3.87 - 5.11 MIL/uL   Hemoglobin 10.6 (L) 12.0 - 15.0 g/dL   HCT 05.3 (L) 97.6 - 73.4 %   MCV 85.2 80.0 - 100.0 fL   MCH 30.8 26.0 - 34.0 pg   MCHC 36.2 (H) 30.0 - 36.0 g/dL   RDW 19.3 79.0 - 24.0 %   Platelets 326 150 - 400 K/uL   nRBC 0.0 0.0 - 0.2 %  Comprehensive metabolic panel     Status: Abnormal   Collection Time: 04/15/19  6:40 PM  Result Value Ref Range   Sodium 137 135 - 145 mmol/L   Potassium 3.2 (L) 3.5 - 5.1 mmol/L   Chloride 103 98 - 111 mmol/L   CO2 24 22 - 32 mmol/L   Glucose, Bld 93 70 - 99 mg/dL   BUN 6 6 - 20 mg/dL   Creatinine, Ser 9.73 0.44 - 1.00 mg/dL   Calcium 8.6 (L) 8.9 - 10.3 mg/dL   Total Protein 6.2 (L) 6.5 - 8.1 g/dL   Albumin 3.1 (L) 3.5 - 5.0 g/dL   AST 24 15 - 41 U/L   ALT 31 0 - 44 U/L   Alkaline Phosphatase 108 38 - 126 U/L   Total Bilirubin 0.7 0.3 - 1.2 mg/dL   GFR calc non Af Amer >60 >60 mL/min   GFR calc Af Amer >60 >60 mL/min   Anion gap 10 5 - 15   Vitals:   04/15/19 1845 04/15/19 1900 04/15/19 1901 04/15/19 1915  BP: (!) 144/83 (!) 162/92 (!) 162/92 (!) 172/93  Pulse: 64  61 (!) 58  Resp:      Temp:      TempSrc:      SpO2:      Weight:      Height:         MAU Course  Procedures  MDM 7:28 PM DW. Dr. Adrian Blackwater, patient with 2 severe range BPs. Will start IV anti-hypertensives and Mag. Admission orders placed    Assessment and Plan   Postpartum pre-eclampsia with severe features Admit to antenatal Magnesium Antihypertensives  Thressa Sheller  DNP, CNM  04/15/19  7:29 PM

## 2019-04-15 NOTE — ED Provider Notes (Signed)
Patient LWBS.  I reviewed her triage note.  PP 1 week with elevated BP.  Triage BP 174/109.  Was noted to be elevated at son's circumcision appointment yesterday by OB.  Recommended to monitor at home.  No lab work available.  I have asked Charge RN to call patient to return for evaluation with concern for potential post partum pre-eclampsia.  Per Agricultural consultant, message was left.   Merryl Hacker, MD 04/15/19 [phone]

## 2019-04-15 NOTE — Telephone Encounter (Signed)
Pt reports that pressures yesterday afternoon were elevated at 183/107, now 176/106. Had mild h/a. No h/a now. Pt says she cannot go to [facility] this morning. Last night she did not go to [facility] as directed, but went to [facility], was in ED waiting room x 6 hrs and left without being seen. I have called in Labetalol 200 mg, for pt to take 2 pills this a.m and we will speak in 4 hours. Pr/ Cr from yesterday's visit pending.

## 2019-04-15 NOTE — H&P (Signed)
History     CSN: [id number]  Arrival date and time: 04/15/19 1744   First Provider Initiated Contact with Patient 04/15/19 1848      Chief Complaint  Patient presents with  . Hypertension   Jasmine Bailey is a 23 y.o. [id number] who 8 days S/P NSVD. She was at her OB office yesterday for baby's circumcision. Blood pressure was elevated. Dr. Emelda FearFerguson started her on labetalol and advised her to be seen. She was not seen yesterday. She took one dose of labetalol today. 400mg  today. She denies any HA, visual disturbance or RUQ pain. She states that she had a headache yesterday, but it is better now. She did not have any blood pressure problems during pregnaKeyona Laural Bailey is a 23 y.o. [id number] who 8 days S/P NSVD. She was at her OB office yesterday for baby's ncy and did not get Mag while she was here in the hospital.    OB History    Gravida  3   Para  2   Term  2   Preterm      AB  1   Living  2     SAB      TAB  1   Ectopic      Multiple  0   Live Births  2           Past Medical History:  Diagnosis Date  . Medical history non-contributory     Past Surgical History:  Procedure Laterality Date  . NO PAST SURGERIES      Family History  Problem Relation Age of Onset  . Hypertension Other   . Diabetes Other   . Congestive Heart Failure Maternal Grandfather   . Hypertension Father     Social History   Tobacco Use  . Smoking status: Former Smoker    Packs/day: 0.00    Years: 5.00    Pack years: 0.00    Types: Cigarettes    Quit date: 08/27/2012    Years since quitting: 6.6  . Smokeless tobacco: Never Used  Substance Use Topics  . Alcohol use: Not Currently    Comment: occasionally   . 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     Allergies: No Known Allergies  Medications Prior to Admission  Medication Sig Dispense Refill Last Dose  . labetalol (NORMODYNE) 200 MG tablet Take 2 tablets (400 mg total) by mouth 2 (two)  times daily. 60 tablet 3 04/15/2019 at Unknown time  . Prenatal MV-Min-FA-Omega-3 (PRENATAL GUMMIES/DHA & FA PO) Take by mouth.   04/14/2019 at Unknown time  . ferrous sulfate 325 (65 FE) MG tablet Take 1 tablet (325 mg total) by mouth 2 (two) times daily with a meal. 60 tablet 3 Unknown at Unknown time  . ibuprofen (ADVIL) 600 MG tablet Take 1 tablet (600 mg total) by mouth every 6 (six) hours. 30 tablet 1 Unknown at Unknown time  . senna-docusate (SENOKOT-S) 8.6-50 MG tablet Take 2 tablets by mouth daily. 20 tablet 0     Review of Systems  Constitutional: Negative for chills and fever.  Eyes: Negative for visual disturbance.  Gastrointestinal: Negative for abdominal pain.  Neurological: Negative for headaches.   Physical Exam   Blood pressure (!) 144/83, pulse 64, temperature 98.4 F (36.9 C), resp. rate 18, height 5\' 3"  (1.6 m), weight 62.9 kg, SpO2 100 %, not currently breastfeeding.  Physical Exam  Nursing note and vitals reviewed. Constitutional: She  is oriented to person, place, and time. She appears well-developed and well-nourished. No distress.  HENT:  Head: Normocephalic.  Cardiovascular: Normal rate.  Respiratory: Effort normal.  GI: Soft. There is no abdominal tenderness. There is no rebound.  Neurological: She is alert and oriented to person, place, and time.  Skin: Skin is warm and dry.  Psychiatric: She has a normal mood and affect.   Results for orders placed or performed during the hospital encounter of 04/15/19 (from the past 24 hour(s))  Urinalysis, Routine w reflex microscopic     Status: Abnormal   Collection Time: 04/15/19  6:31 PM  Result Value Ref Range   Color, Urine STRAW (A) YELLOW   APPearance CLEAR CLEAR   Specific Gravity, Urine 1.004 (L) 1.005 - 1.030   pH 6.0 5.0 - 8.0   Glucose, UA NEGATIVE NEGATIVE mg/dL   Hgb urine dipstick MODERATE (A) NEGATIVE   Bilirubin Urine NEGATIVE NEGATIVE   Ketones, ur NEGATIVE NEGATIVE mg/dL   Protein, ur NEGATIVE  NEGATIVE mg/dL   Nitrite NEGATIVE NEGATIVE   Leukocytes,Ua TRACE (A) NEGATIVE   RBC / HPF 0-5 0 - 5 RBC/hpf   WBC, UA 0-5 0 - 5 WBC/hpf   Bacteria, UA RARE (A) NONE SEEN   Squamous Epithelial / LPF 0-5 0 - 5   Mucus PRESENT   CBC     Status: Abnormal   Collection Time: 04/15/19  6:40 PM  Result Value Ref Range   WBC 4.9 4.0 - 10.5 K/uL   RBC 3.44 (L) 3.87 - 5.11 MIL/uL   Hemoglobin 10.6 (L) 12.0 - 15.0 g/dL   HCT 29.3 (L) 36.0 - 46.0 %   MCV 85.2 80.0 - 100.0 fL   MCH 30.8 26.0 - 34.0 pg   MCHC 36.2 (H) 30.0 - 36.0 g/dL   RDW 12.6 11.5 - 15.5 %   Platelets 326 150 - 400 K/uL   nRBC 0.0 0.0 - 0.2 %  Comprehensive metabolic panel     Status: Abnormal   Collection Time: 04/15/19  6:40 PM  Result Value Ref Range   Sodium 137 135 - 145 mmol/L   Potassium 3.2 (L) 3.5 - 5.1 mmol/L   Chloride 103 98 - 111 mmol/L   CO2 24 22 - 32 mmol/L   Glucose, Bld 93 70 - 99 mg/dL   BUN 6 6 - 20 mg/dL   Creatinine, Ser 0.72 0.44 - 1.00 mg/dL   Calcium 8.6 (L) 8.9 - 10.3 mg/dL   Total Protein 6.2 (L) 6.5 - 8.1 g/dL   Albumin 3.1 (L) 3.5 - 5.0 g/dL   AST 24 15 - 41 U/L   ALT 31 0 - 44 U/L   Alkaline Phosphatase 108 38 - 126 U/L   Total Bilirubin 0.7 0.3 - 1.2 mg/dL   GFR calc non Af Amer >60 >60 mL/min   GFR calc Af Amer >60 >60 mL/min   Anion gap 10 5 - 15   Vitals:   04/15/19 1845 04/15/19 1900 04/15/19 1901 04/15/19 1915  BP: (!) 144/83 (!) 162/92 (!) 162/92 (!) 172/93  Pulse: 64  61 (!) 58  Resp:      Temp:      TempSrc:      SpO2:      Weight:      Height:         MAU Course  Procedures  MDM 7:28 PM DW. Dr. Nehemiah Settle, patient with 2 severe range BPs. Will start IV anti-hypertensives and Mag. Admission orders  placed    Assessment and Plan   Postpartum pre-eclampsia with severe features Admit to antenatal Magnesium Antihypertensives  Thressa Sheller DNP, CNM  04/15/19  7:29 PM    Magnesium for 24 hours.  Will watch BP.  Repeat labs in AM.  Levie Heritage, DO

## 2019-04-15 NOTE — Plan of Care (Signed)
  Problem: Education: Goal: Knowledge of General Education information will improve Description: Including pain rating scale, medication(s)/side effects and non-pharmacologic comfort measures Outcome: Completed/Met

## 2019-04-15 NOTE — MAU Note (Signed)
Pt was seen at office yesterday and found to by hypertensive. Was told to come to MAU today for further evaluation. Had headache yesterday, denies vision changes, RUQ pain.

## 2019-04-15 NOTE — ED Notes (Signed)
Called pt to be taken to a room; no answer, registration states they saw the pt's boyfriend take her out of facility in a wheelchair and brought the wheelchair back in

## 2019-04-16 LAB — CBC
HCT: 30.1 % — ABNORMAL LOW (ref 36.0–46.0)
Hemoglobin: 10.7 g/dL — ABNORMAL LOW (ref 12.0–15.0)
MCH: 30.7 pg (ref 26.0–34.0)
MCHC: 35.5 g/dL (ref 30.0–36.0)
MCV: 86.2 fL (ref 80.0–100.0)
Platelets: 345 10*3/uL (ref 150–400)
RBC: 3.49 MIL/uL — ABNORMAL LOW (ref 3.87–5.11)
RDW: 12.9 % (ref 11.5–15.5)
WBC: 5.6 10*3/uL (ref 4.0–10.5)
nRBC: 0 % (ref 0.0–0.2)

## 2019-04-16 LAB — COMPREHENSIVE METABOLIC PANEL
ALT: 27 U/L (ref 0–44)
AST: 23 U/L (ref 15–41)
Albumin: 2.8 g/dL — ABNORMAL LOW (ref 3.5–5.0)
Alkaline Phosphatase: 100 U/L (ref 38–126)
Anion gap: 11 (ref 5–15)
BUN: <5 mg/dL — ABNORMAL LOW (ref 6–20)
CO2: 24 mmol/L (ref 22–32)
Calcium: 7.3 mg/dL — ABNORMAL LOW (ref 8.9–10.3)
Chloride: 106 mmol/L (ref 98–111)
Creatinine, Ser: 0.66 mg/dL (ref 0.44–1.00)
GFR calc Af Amer: >60 mL/min (ref >60)
GFR calc non Af Amer: >60 mL/min (ref >60)
Glucose, Bld: 102 mg/dL — ABNORMAL HIGH (ref 70–99)
Potassium: 2.8 mmol/L — ABNORMAL LOW (ref 3.5–5.1)
Sodium: 141 mmol/L (ref 135–145)
Total Bilirubin: 0.5 mg/dL (ref 0.3–1.2)
Total Protein: 5.9 g/dL — ABNORMAL LOW (ref 6.5–8.1)

## 2019-04-16 LAB — SARS CORONAVIRUS 2 (TAT 6-24 HRS): SARS Coronavirus 2: NEGATIVE

## 2019-04-16 NOTE — Plan of Care (Signed)
  Problem: Education: Goal: Knowledge of disease or condition will improve Outcome: Progressing   

## 2019-04-16 NOTE — Progress Notes (Signed)
Post Partum Day 9 Subjective: Patient reports feeling well this morning. She denies HA, visual changes, RUQ/epigastric pain,nausea or emesis  Objective: Blood pressure 128/71, pulse 86, temperature 97.8 F (36.6 C), temperature source Oral, resp. rate 17, height 5\' 3"  (1.6 m), weight 62.9 kg, SpO2 97 %, not currently breastfeeding.  Physical Exam:  General: alert, cooperative and no distress Lochia: appropriate Uterine Fundus: firm DVT Evaluation: No evidence of DVT seen on physical exam.  Recent Labs    04/15/19 1840 04/16/19 0557  HGB 10.6* 10.7*  HCT 29.3* 30.1*    Assessment/Plan: 23 yo admitted with postpartum preeclampsia BP stable without any antihypertensive Continue magnesium sulfate for seizure prophylaxis to be discontinued late this evening Continue current postpartum care   LOS: 1 day   Quintavius Niebuhr 04/16/2019, 10:19 AM

## 2019-04-17 MED ORDER — LISINOPRIL 20 MG PO TABS
20.0000 mg | ORAL_TABLET | Freq: Every day | ORAL | Status: DC
  Administered 2019-04-18: 09:00:00 20 mg via ORAL
  Filled 2019-04-17: qty 1

## 2019-04-17 MED ORDER — NIFEDIPINE ER OSMOTIC RELEASE 30 MG PO TB24
30.0000 mg | ORAL_TABLET | Freq: Two times a day (BID) | ORAL | Status: DC
  Administered 2019-04-17 – 2019-04-18 (×3): 30 mg via ORAL
  Filled 2019-04-17 (×3): qty 1

## 2019-04-17 MED ORDER — ENALAPRIL MALEATE 5 MG PO TABS
10.0000 mg | ORAL_TABLET | Freq: Every day | ORAL | Status: DC
  Administered 2019-04-17: 10:00:00 10 mg via ORAL
  Filled 2019-04-17 (×2): qty 2

## 2019-04-17 NOTE — Progress Notes (Signed)
Post Partum Day 10 Subjective: Patient is doing well without complaints. She denies HA, visual changes, RUQ/epigastric pain, nausea or emesis  Objective: Blood pressure (!) 162/80, pulse (!) 53, temperature 98.2 F (36.8 C), temperature source Oral, resp. rate 18, height 5\' 3"  (1.6 m), weight 62.9 kg, SpO2 98 %, not currently breastfeeding.  Physical Exam:  General: alert, cooperative and no distress Lochia: appropriate Uterine Fundus: firm DVT Evaluation: No evidence of DVT seen on physical exam.  Recent Labs    04/15/19 1840 04/16/19 0557  HGB 10.6* 10.7*  HCT 29.3* 30.1*    Assessment/Plan: BP elevated this morning Enalapril started Patient desires to be discharged today. Plan to re-evaluate patient for discharge this afternoon   LOS: 2 days   Kataryna Mcquilkin 04/17/2019, 9:46 AM

## 2019-04-18 MED ORDER — NIFEDIPINE ER 30 MG PO TB24: 30.0000 mg | ORAL_TABLET | Freq: Two times a day (BID) | ORAL | 1 refills | Status: DC

## 2019-04-18 MED ORDER — LISINOPRIL 20 MG PO TABS: 20.0000 mg | ORAL_TABLET | Freq: Every day | ORAL | 1 refills | Status: DC

## 2019-04-18 NOTE — Plan of Care (Signed)
  Problem: Clinical Measurements: Goal: Ability to maintain clinical measurements within normal limits will improve Outcome: Progressing  BP has remained stable overnight and this am up to this point.

## 2019-04-18 NOTE — Discharge Summary (Signed)
Postpartum Discharge Summary     Patient Name: Jasmine Bailey DOB: 1995-12-12 MRN: [id number]  Date of admission: 04/15/2019 Delivering Provider: This patient has no babies on file.  Date of discharge: 04/18/2019  Admitting diagnosis: Elevated BP Intrauterine pregnancy: Unknown     Secondary diagnosis:  Active Problems:   Preeclampsia in postpartum period    Discharge diagnosis: postpartum preeclampsia                                   Hospital course:  Patient admitted with severe range blood pressure. Patient admitted with diagnosis of postpartum preeclampsia and received magnesium sulfate for seizure prophylaxis for 24 hours. Patient was also started on antihypertensive lisinopril and procardia. Patient found stable for discharge on HD#2. Discharge instructions were reviewed and [facility]  Magnesium Sulfate recieved: Yes   Physical exam  Vitals:   04/17/19 1947 04/17/19 2244 04/18/19 0426 04/18/19 0734  BP: (!) 158/95 (!) 149/97 131/72 140/88  Pulse: 66 70 86 70  Resp: 18 17 17 18   Temp: 98 F (36.7 C) 98.2 F (36.8 C) 98 F (36.7 C) 98.3 F (36.8 C)  TempSrc: Oral  Oral Oral  SpO2: 96% 100% 100% 96%  Weight:      Height:       General: alert, cooperative and no distress Lochia: appropriate Uterine Fundus: firm DVT Evaluation: No evidence of DVT seen on physical exam.  Labs: Lab Results  Component Value Date   WBC 5.6 04/16/2019   HGB 10.7 (L) 04/16/2019   HCT 30.1 (L) 04/16/2019   MCV 86.2 04/16/2019   PLT 345 04/16/2019   CMP Latest Ref Rng & Units 04/16/2019  Glucose 70 - 99 mg/dL 102(H)  BUN 6 - 20 mg/dL <5(L)  Creatinine 0.44 - 1.00 mg/dL 0.66  Sodium 135 - 145 mmol/L 141  Potassium 3.5 - 5.1 mmol/L 2.8(L)  Chloride 98 - 111 mmol/L 106  CO2 22 - 32 mmol/L 24  Calcium 8.9 - 10.3 mg/dL 7.3(L)  Total Protein 6.5 - 8.1 g/dL 5.9(L)  Total Bilirubin 0.3 - 1.2 mg/dL 0.5  Alkaline Phos 38 - 126 U/L 100  AST 15 - 41  U/L 23  ALT 0 - 44 U/L 27    Discharge instruction: per After Visit Summary and "Baby and Me Booklet".  After visit meds:  Allergies as of 04/18/2019   No Known Allergies     Medication List    STOP taking these medications   labetalol 200 MG tablet Commonly known as: NORMODYNE     TAKE these medications   ferrous sulfate 325 (65 FE) MG tablet Take 1 tablet (325 mg total) by mouth 2 (two) times daily with a meal.   ibuprofen 600 MG tablet Commonly known as: ADVIL Take 1 tablet (600 mg total) by mouth every 6 (six) hours.   lisinopril 20 MG tablet Commonly known as: ZESTRIL Take 1 tablet (20 mg total) by mouth daily.   NIFEdipine 30 MG 24 hr tablet Commonly known as: ADALAT CC Take 1 tablet (30 mg total) by mouth 2 (two) times daily.   PRENATAL GUMMIES/DHA & FA PO Take by mouth.   senna-docusate 8.6-50 MG tablet Commonly known as: Senokot-S Take 2 tablets by mouth daily.       Diet: routine diet  Activity: Advance as tolerated. Pelvic rest for 6 weeks.   Outpatient follow up:  next week for BP check Follow up Appt: Future Appointments  Date Time Provider Department Center  05/12/2019  1:30 PM Cheral MarkerBooker, Kimberly R, CNM [facility]   Follow up Visit: Follow-up Information    [facility] Follow up.   Why: Please contact the office for a blood pressure check this week Contact information: [address] [place] [id number] [phone]              04/18/2019 Catalina AntiguaPeggy Zeda Gangwer, MD

## 2019-04-18 NOTE — Discharge Instructions (Signed)
Postpartum Hypertension °Postpartum hypertension is high blood pressure that remains higher than normal after childbirth. You may not realize that you have postpartum hypertension if your blood pressure is not being checked regularly. In most cases, postpartum hypertension will go away on its own, usually within a week of delivery. However, for some women, medical treatment is required to prevent serious complications, such as seizures or stroke. °What are the causes? °This condition may be caused by one or more of the following: °· Hypertension that existed before pregnancy (chronic hypertension). °· Hypertension that comes on as a result of pregnancy (gestational hypertension). °· Hypertensive disorders during pregnancy (preeclampsia) or seizures in women who have high blood pressure during pregnancy (eclampsia). °· A condition in which the liver, platelets, and red blood cells are damaged during pregnancy (HELLP syndrome). °· A condition in which the thyroid produces too much hormones (hyperthyroidism). °· Other rare problems of the nerves (neurological disorders) or blood disorders. °In some cases, the cause may not be known. °What increases the risk? °The following factors may make you more likely to develop this condition: °· Chronic hypertension. In some cases, this may not have been diagnosed before pregnancy. °· Obesity. °· Type 2 diabetes. °· Kidney disease. °· History of preeclampsia or eclampsia. °· Other medical conditions that change the level of hormones in the body (hormonal imbalance). °What are the signs or symptoms? °As with all types of hypertension, postpartum hypertension may not have any symptoms. Depending on how high your blood pressure is, you may experience: °· Headaches. These may be mild, moderate, or severe. They may also be steady, Morty Ortwein, or sudden in onset (thunderclap headache). °· Changes in your ability to see (visual changes). °· Dizziness. °· Shortness of breath. °· Swelling  of your hands, feet, lower legs, or face. In some cases, you may have swelling in more than one of these locations. °· Heart palpitations or a racing heartbeat. °· Difficulty breathing while lying down. °· Decrease in the amount of urine that you pass. °Other rare signs and symptoms may include: °· Sweating more than usual. This lasts longer than a few days after delivery. °· Chest pain. °· Sudden dizziness when you get up from sitting or lying down. °· Seizures. °· Nausea or vomiting. °· Abdominal pain. °How is this diagnosed? °This condition may be diagnosed based on the results of a physical exam, blood pressure measurements, and blood and urine tests. °You may also have other tests, such as a CT scan or an MRI, to check for other problems of postpartum hypertension. °How is this treated? °If blood pressure is high enough to require treatment, your options may include: °· Medicines to reduce blood pressure (antihypertensives). Tell your health care provider if you are breastfeeding or if you plan to breastfeed. There are many antihypertensive medicines that are safe to take while breastfeeding. °· Stopping medicines that may be causing hypertension. °· Treating medical conditions that are causing hypertension. °· Treating the complications of hypertension, such as seizures, stroke, or kidney problems. °Your health care provider will also continue to monitor your blood pressure closely until it is within a safe range for you. °Follow these instructions at home: °· Take over-the-counter and prescription medicines only as told by your health care provider. °· Return to your normal activities as told by your health care provider. Ask your health care provider what activities are safe for you. °· Do not use any products that contain nicotine or tobacco, such as cigarettes and e-cigarettes. If   you need help quitting, ask your health care provider. °· Keep all follow-up visits as told by your health care provider. This  is important. °Contact a health care provider if: °· Your symptoms get worse. °· You have new symptoms, such as: °? A headache that does not get better. °? Dizziness. °? Visual changes. °Get help right away if: °· You suddenly develop swelling in your hands, ankles, or face. °· You have sudden, rapid weight gain. °· You develop difficulty breathing, chest pain, racing heartbeat, or heart palpitations. °· You develop severe pain in your abdomen. °· You have any symptoms of a stroke. "BE FAST" is an easy way to remember the main warning signs of a stroke: °? B - Balance. Signs are dizziness, sudden trouble walking, or loss of balance. °? E - Eyes. Signs are trouble seeing or a sudden change in vision. °? F - Face. Signs are sudden weakness or numbness of the face, or the face or eyelid drooping on one side. °? A - Arms. Signs are weakness or numbness in an arm. This happens suddenly and usually on one side of the body. °? S - Speech. Signs are sudden trouble speaking, slurred speech, or trouble understanding what people say. °? T - Time. Time to call emergency services. Write down what time symptoms started. °· You have other signs of a stroke, such as: °? A sudden, severe headache with no known cause. °? Nausea or vomiting. °? Seizure. °These symptoms may represent a serious problem that is an emergency. Do not wait to see if the symptoms will go away. Get medical help right away. Call your local emergency services (911 in the [place] Do not drive yourself to the hospital. °Summary °· Postpartum hypertension is high blood pressure that remains higher than normal after childbirth. °· In most cases, postpartum hypertension will go away on its own, usually within a week of delivery. °· For some women, medical treatment is required to prevent serious complications, such as seizures or stroke. °This information is not intended to replace advice given to you by your health care provider. Make sure you discuss any questions  you have with your health care provider. °Document Released: 04/10/2014 Document Revised: 09/13/2018 Document Reviewed: 05/28/2017 °Elsevier Patient Education © 2020 [employer] ° °

## 2019-04-18 NOTE — Progress Notes (Signed)
Pt discharged after discharge instructions given. All questions answered. IV discontinued. Pt ambulated at discharge in stable condition.

## 2019-04-21 ENCOUNTER — Telehealth: Payer: Self-pay | Admitting: Women's Health

## 2019-04-21 NOTE — Telephone Encounter (Signed)
Pt went to er and told her to call and f/u on her bp/ she has been checking and it is fine and she is taking BP meds .  SHe said she is fine not coming but she is just doing what she was told can you please advise it pt needs to come in and have bp checked

## 2019-04-23 ENCOUNTER — Other Ambulatory Visit: Payer: Self-pay

## 2019-04-23 ENCOUNTER — Encounter: Payer: Self-pay | Admitting: *Deleted

## 2019-04-23 ENCOUNTER — Telehealth (INDEPENDENT_AMBULATORY_CARE_PROVIDER_SITE_OTHER): Payer: 59 | Admitting: *Deleted

## 2019-04-23 ENCOUNTER — Telehealth: Payer: Self-pay | Admitting: *Deleted

## 2019-04-23 VITALS — BP 112/74 | HR 90 | Ht 62.0 in

## 2019-04-23 DIAGNOSIS — Z0130 Encounter for examination of blood pressure without abnormal findings: Secondary | ICD-10-CM

## 2019-04-23 NOTE — Progress Notes (Signed)
Pt had BP check. BP today was 112/74. Pt is taking Nifedipine 30 mg BID. She has her postpartum visit scheduled for 9/21. [place]

## 2019-04-23 NOTE — Telephone Encounter (Signed)
Pt had a BP check today. BP was 112/74. She is taking Nifedipine 30 mg BID. She has a postpartum visit scheduled for 9/21. Does she need to make any changes? Thanks!! [place]

## 2019-04-24 NOTE — Telephone Encounter (Signed)
Pt aware. JSY 

## 2019-05-07 ENCOUNTER — Encounter: Payer: Self-pay | Admitting: *Deleted

## 2019-05-12 ENCOUNTER — Encounter: Payer: Self-pay | Admitting: Women's Health

## 2019-05-12 ENCOUNTER — Other Ambulatory Visit: Payer: Self-pay

## 2019-05-12 ENCOUNTER — Telehealth (INDEPENDENT_AMBULATORY_CARE_PROVIDER_SITE_OTHER): Payer: 59 | Admitting: Women's Health

## 2019-05-12 DIAGNOSIS — D573 Sickle-cell trait: Secondary | ICD-10-CM

## 2019-05-12 MED ORDER — ETONOGESTREL-ETHINYL ESTRADIOL 0.12-0.015 MG/24HR VA RING: VAGINAL_RING | VAGINAL | 3 refills | Status: DC

## 2019-05-12 NOTE — Progress Notes (Signed)
TELEHEALTH VIRTUAL POSTPARTUM VISIT ENCOUNTER NOTE Patient name: Jasmine Bailey MRN [id number]  Date of birth: [birth year]  I connected with patient on 05/12/19 at  1:30 PM EDT by MyChart video  and verified that I am speaking with the correct person using two identifiers. Due to COVID-19 recommendations, pt is not currently in our office.    I discussed the limitations, risks, security and privacy concerns of performing an evaluation and management service by telephone and the availability of in person appointments. I also discussed with the patient that there may be a patient responsible charge related to this service. The patient expressed understanding and agreed to proceed.  Chief Complaint:   Postpartum Care  History of Present Illness:   Jasmine Bailey is a 23 y.o. G17P2012 African American female being evaluated today for a postpartum visit. She is 5 weeks postpartum following a spontaneous vaginal delivery at 39.3 gestational weeks. Anesthesia: epidural. Laceration: none. I have fully reviewed the prenatal and intrapartum course. Pregnancy complicated by polyhydramnios. Postpartum course has been complicated by pp pre-e w/ re-admit at 8d pp, d/c'd on meds, stopped 1wk ago as adivsed. Bleeding no bleeding. Bowel function is normal. Bladder function is normal.  Patient is sexually active. Last sexual activity: last week.  Contraception method is wants nuvaring. No h/o CHTN, DVT/PE, CVA, MI, or migraines w/ aura. Does smoke.  Last pap 06/25/18.  Results were normal .  No LMP recorded.  Baby's course has been uncomplicated. Baby is feeding by bottle   Edinburgh Postpartum Depression Screening: negative Edinburgh Postnatal Depression Scale - 05/12/19 1338      Edinburgh Postnatal Depression Scale:  In the Past 7 Days   I have been able to laugh and see the funny side of things.  0    I have looked forward with enjoyment to things.  0    I have blamed myself unnecessarily when things went  wrong.  0    I have been anxious or worried for no good reason.  0    I have felt scared or panicky for no good reason.  0    Things have been getting on top of me.  0    I have been so unhappy that I have had difficulty sleeping.  0    I have felt sad or miserable.  0    I have been so unhappy that I have been crying.  0    The thought of harming myself has occurred to me.  0    Edinburgh Postnatal Depression Scale Total  0      Review of Systems:   Pertinent items are noted in HPI Denies Abnormal vaginal discharge w/ itching/odor/irritation, headaches, visual changes, shortness of breath, chest pain, abdominal pain, severe nausea/vomiting, or problems with urination or bowel movements. Pertinent History Reviewed:  Reviewed past medical,surgical, obstetrical and family history.  Reviewed problem list, medications and allergies. OB History  Gravida Para Term Preterm AB Living  3 2 2   1 2   SAB TAB Ectopic Multiple Live Births    1   0 2    # Outcome Date GA Lbr Len/2nd Weight Sex Delivery Anes PTL Lv  3 Term 04/07/19 [redacted]w[redacted]d 10:50 / 00:57 8 lb 1.1 oz (3.66 kg) M Vag-Spont EPI  LIV  2 TAB 2017          1 Term 01/26/13 [redacted]w[redacted]d 15:15 / 01:12 8 lb 0.2 oz (3.635 kg) F Vag-Spont EPI N LIV  Birth Comments: 4 skin tags on right ear   Physical Assessment:   Vitals:   05/12/19 1340  BP: 128/74  Pulse: 94  There is no height or weight on file to calculate BMI.       Physical Examination:  General:  Alert, oriented and cooperative.   Mental Status: Normal mood and affect perceived. Normal judgment and thought content.  Rest of physical exam deferred due to type of encounter       No results found for this or any previous visit (from the past 24 hour(s)).  Assessment & Plan:  1) Postpartum exam 2) 5 wks s/p SVB w/ readmit for pp pre-e 3) Resolved pp HTN 4) Bottlefeeding 5) Depression screening 6) Contraception counseling, pt prefers NuvaRing vaginal inserts, condoms x 2wks  Meds:   Meds ordered this encounter  Medications  . etonogestrel-ethinyl estradiol (NUVARING) 0.12-0.015 MG/24HR vaginal ring    Sig: Insert vaginally and leave in place for 3 consecutive weeks, then remove for 1 week.    Dispense:  3 each    Refill:  3    Order Specific Question:   Supervising Provider    Answer:   Tania Ade H [[id number]]    I discussed the assessment and treatment plan with the patient. The patient was provided an opportunity to ask questions and all were answered. The patient agreed with the plan and demonstrated an understanding of the instructions.   The patient was advised to call back or seek an in-person evaluation/go to the ED for any concerning postpartum symptoms.  I provided 15 minutes of non-face-to-face time during this encounter.  Follow-up: Return in about 3 months (around 08/11/2019) for F/U, MyChart Video.   No orders of the defined types were placed in this encounter.   [place], [facility] 05/12/2019 2:16 PM

## 2019-05-13 ENCOUNTER — Encounter: Payer: Self-pay | Admitting: *Deleted

## 2019-05-14 DIAGNOSIS — R51 Headache: Secondary | ICD-10-CM | POA: Diagnosis not present

## 2019-05-14 DIAGNOSIS — Z20828 Contact with and (suspected) exposure to other viral communicable diseases: Secondary | ICD-10-CM | POA: Diagnosis not present

## 2019-05-15 DIAGNOSIS — Z20828 Contact with and (suspected) exposure to other viral communicable diseases: Secondary | ICD-10-CM | POA: Diagnosis not present

## 2019-05-19 DIAGNOSIS — N390 Urinary tract infection, site not specified: Secondary | ICD-10-CM | POA: Diagnosis not present

## 2019-05-19 DIAGNOSIS — R109 Unspecified abdominal pain: Secondary | ICD-10-CM | POA: Diagnosis not present

## 2019-08-12 ENCOUNTER — Telehealth: Payer: 59 | Admitting: Women's Health

## 2019-08-12 ENCOUNTER — Other Ambulatory Visit: Payer: Self-pay

## 2019-11-06 ENCOUNTER — Other Ambulatory Visit: Payer: Self-pay

## 2019-11-06 ENCOUNTER — Ambulatory Visit (INDEPENDENT_AMBULATORY_CARE_PROVIDER_SITE_OTHER): Payer: Medicaid Other | Admitting: Adult Health

## 2019-11-06 ENCOUNTER — Encounter: Payer: Self-pay | Admitting: Adult Health

## 2019-11-06 VITALS — BP 113/63 | HR 101 | Ht 62.0 in | Wt 124.0 lb

## 2019-11-06 DIAGNOSIS — Z01419 Encounter for gynecological examination (general) (routine) without abnormal findings: Secondary | ICD-10-CM | POA: Diagnosis not present

## 2019-11-06 DIAGNOSIS — Z3A16 16 weeks gestation of pregnancy: Secondary | ICD-10-CM | POA: Insufficient documentation

## 2019-11-06 DIAGNOSIS — Z0000 Encounter for general adult medical examination without abnormal findings: Secondary | ICD-10-CM

## 2019-11-06 DIAGNOSIS — N926 Irregular menstruation, unspecified: Secondary | ICD-10-CM | POA: Diagnosis not present

## 2019-11-06 DIAGNOSIS — Z113 Encounter for screening for infections with a predominantly sexual mode of transmission: Secondary | ICD-10-CM | POA: Insufficient documentation

## 2019-11-06 LAB — POCT URINE PREGNANCY: Preg Test, Ur: POSITIVE — AB

## 2019-11-06 NOTE — Progress Notes (Signed)
Patient ID: Jasmine Bailey, female   DOB: 1996-02-27, 24 y.o.   MRN: [id number] History of Present Illness:  Jasmine Bailey is a 24 year old black female,single, in for well woman gyn exam and said I am pregnant She had normal pap 06/25/18 and has 9 month old at home. She does not want this pregnancy, plans to terminate, not interested in adoption.  PCP is [facility].  Current Medications, Allergies, Past Medical History, Past Surgical History, Family History and Social History were reviewed in [employer] record.     Review of Systems: Patient denies any headaches, hearing loss, fatigue, blurred vision, shortness of breath, chest pain, abdominal pain, problems with bowel movements, urination, or intercourse. No joint pain or mood swings. +missed periods with 2+HPTs    Physical Exam:BP 113/63 (BP Location: Left Arm, Patient Position: Sitting, Cuff Size: Normal)   Pulse (!) 101   Ht 5\' 2"  (1.575 m)   Wt 124 lb (56.2 kg)   LMP 08/17/2019 (Within Days)   Breastfeeding No   BMI 22.68 kg/m UPT +, about 11+4 weeks by LMP with EDD 05/23/20. General:  Well developed, well nourished, no acute distress Skin:  Warm and dry Neck:  Midline trachea, normal thyroid, good ROM, no lymphadenopathy Lungs; Clear to auscultation bilaterally Breast:  No dominant palpable mass, retraction, or nipple discharge Cardiovascular: Regular rate and rhythm Abdomen:  Soft, non tender, no hepatosplenomegaly Pelvic:  External genitalia is normal in appearance, no lesions.  The vagina is normal in appearance. Urethra has no lesions or masses. The cervix is bulbous. Nuswab obtained. Uterus is felt to be about 14-16 week size, POC 07/23/20 show active fetus with +FHM and BPD, and CRL about [redacted] weeks pregnant, so EDD about 04/22/20.  No adnexal masses or tenderness noted.Bladder is non tender, no masses felt. Extremities/musculoskeletal:  No swelling or varicosities noted, no clubbing or cyanosis Psych:  No mood changes,  alert and cooperative,seems happy Fall risk is low PHQ 2 score is 0 Examination chaperoned by 06/22/20 Rash LPN  Impression and Plan:  1. Missed period   2. Encounter for well woman exam with routine gynecological exam Pap and physical in 1 year  3. Screening examination for STD (sexually transmitted disease) Nuswab sent   4. [redacted] weeks gestation of pregnancy Number for [facility] [phone] or [phone], she is aware that no terminations in [place] after 20 weeks and that medicaid does not cover  Take PN Gummies  Cal he changes mind or after procedure for birth control

## 2019-11-10 LAB — NUSWAB VAGINITIS PLUS (VG+)
Candida albicans, NAA: NEGATIVE
Candida glabrata, NAA: NEGATIVE
Chlamydia trachomatis, NAA: NEGATIVE
Neisseria gonorrhoeae, NAA: NEGATIVE
Trich vag by NAA: NEGATIVE

## 2019-11-11 ENCOUNTER — Telehealth: Payer: Self-pay | Admitting: *Deleted

## 2019-11-11 NOTE — Telephone Encounter (Signed)
Patient left voicemail that she had TAB procedure today. She wants to get started on Depo Provera. Would like to know what to do next to get this started.

## 2019-11-12 MED ORDER — MEDROXYPROGESTERONE ACETATE 150 MG/ML IM SUSP: 150.0000 mg | INTRAMUSCULAR | 4 refills | Status: DC

## 2019-11-12 NOTE — Telephone Encounter (Signed)
Pt had termination and wants depo ASAP, is working today but can come tomorrow at 3 pm for depo, rx sent for depo to [facility]

## 2019-11-13 ENCOUNTER — Ambulatory Visit (INDEPENDENT_AMBULATORY_CARE_PROVIDER_SITE_OTHER): Payer: Medicaid Other | Admitting: *Deleted

## 2019-11-13 ENCOUNTER — Other Ambulatory Visit: Payer: Self-pay

## 2019-11-13 ENCOUNTER — Ambulatory Visit: Payer: Medicaid Other

## 2019-11-13 DIAGNOSIS — Z3042 Encounter for surveillance of injectable contraceptive: Secondary | ICD-10-CM | POA: Diagnosis not present

## 2019-11-13 MED ORDER — MEDROXYPROGESTERONE ACETATE 150 MG/ML IM SUSP
150.0000 mg | Freq: Once | INTRAMUSCULAR | Status: AC
  Administered 2019-11-13: 150 mg via INTRAMUSCULAR

## 2019-11-13 NOTE — Progress Notes (Signed)
   NURSE VISIT- INJECTION  SUBJECTIVE:  Jasmine Bailey is a 24 y.o. [phone] female here for a Depo Provera for contraception/period management. She is a GYN patient.   OBJECTIVE:  LMP 08/17/2019 (Within Days)   Appears well, in no apparent distress  Injection administered in: Right upper quad. gluteus  Meds ordered this encounter  Medications  . medroxyPROGESTERone (DEPO-PROVERA) injection 150 mg    ASSESSMENT: GYN patient Depo Provera for contraception/period management PLAN: Follow-up: in 11-13 weeks for next Depo   Rash, Faith Rogue  11/13/2019 4:10 PM

## 2019-11-17 ENCOUNTER — Encounter: Payer: Self-pay | Admitting: Adult Health

## 2019-11-17 ENCOUNTER — Ambulatory Visit (INDEPENDENT_AMBULATORY_CARE_PROVIDER_SITE_OTHER): Payer: Medicaid Other | Admitting: Adult Health

## 2019-11-17 ENCOUNTER — Other Ambulatory Visit: Payer: Self-pay

## 2019-11-17 VITALS — BP 135/95 | HR 87 | Ht 62.0 in | Wt 123.0 lb

## 2019-11-17 DIAGNOSIS — F329 Major depressive disorder, single episode, unspecified: Secondary | ICD-10-CM

## 2019-11-17 DIAGNOSIS — F32A Depression, unspecified: Secondary | ICD-10-CM

## 2019-11-17 MED ORDER — ESCITALOPRAM OXALATE 10 MG PO TABS: 10.0000 mg | ORAL_TABLET | Freq: Every day | ORAL | 2 refills | Status: DC

## 2019-11-17 MED ORDER — HYDROXYZINE HCL 10 MG PO TABS: 10.0000 mg | ORAL_TABLET | Freq: Three times a day (TID) | ORAL | 0 refills | Status: DC | PRN

## 2019-11-17 NOTE — Patient Instructions (Signed)

## 2019-11-17 NOTE — Progress Notes (Signed)
  Subjective:     Patient ID: Jasmine Bailey, female   DOB: [birth year], 24 y.o.   MRN: [id number]  HPI Jasmine Bailey is a 24 year old black female,single, [id number], in complaining of depression and she is teary today. She had TAB 11/11/19 at about 17 weeks in [place] and received depo 11/13/19, she is not had appetite and not sleeping well.  PCP is [facility].   Review of Systems +depression, see HPI for positives Reviewed past medical,surgical, social and family history. Reviewed medications and allergies.     Objective:   Physical Exam BP (!) 135/95 (BP Location: Left Arm, Patient Position: Sitting, Cuff Size: Normal)   Pulse 87   Ht 5\' 2"  (1.575 m)   Wt 123 lb (55.8 kg)   Breastfeeding No   BMI 22.50 kg/m   Alcohol audit is 7, fall risk is low, PHQ 9 score is 7 denies any Si or HI, is open to meds, mom gave her name of counselor. Will rx lexapro and vistaril.  Skin warm and dry.  Lungs: clear to ausculation bilaterally. Cardiovascular: regular rate and rhythm.    Assessment:     1. Depression, unspecified depression type Will rx lexapro and vistaril Call counselor Meds ordered this encounter  Medications  . escitalopram (LEXAPRO) 10 MG tablet    Sig: Take 1 tablet (10 mg total) by mouth daily.    Dispense:  30 tablet    Refill:  2    Order Specific Question:   Supervising Provider    Answer:   , LUTHER H [[id number]]  . hydrOXYzine (ATARAX/VISTARIL) 10 MG tablet    Sig: Take 1 tablet (10 mg total) by mouth 3 (three) times daily as needed.    Dispense:  30 tablet    Refill:  0    Order Specific Question:   Supervising Provider    Answer:   Despina Hidden [[id number]]  Take time for self Review handout on depression     Plan:     Follow up in 4 weeks or sooner if needed, and if needs referral to counselor let me know

## 2019-12-16 ENCOUNTER — Ambulatory Visit: Payer: Medicaid Other | Admitting: Adult Health

## 2020-02-03 ENCOUNTER — Ambulatory Visit: Payer: Medicaid Other

## 2021-10-27 ENCOUNTER — Other Ambulatory Visit: Payer: 59 | Admitting: Adult Health

## 2021-11-15 ENCOUNTER — Other Ambulatory Visit: Payer: 59 | Admitting: Obstetrics & Gynecology

## 2022-01-06 ENCOUNTER — Encounter: Payer: Self-pay | Admitting: Obstetrics and Gynecology

## 2022-01-26 ENCOUNTER — Encounter: Payer: Self-pay | Admitting: Obstetrics and Gynecology

## 2022-02-08 ENCOUNTER — Encounter: Payer: Self-pay | Admitting: Obstetrics and Gynecology

## 2022-03-20 ENCOUNTER — Telehealth: Payer: Self-pay | Admitting: Obstetrics and Gynecology

## 2022-03-20 NOTE — Telephone Encounter (Signed)
Called to inform pt of appointment change- unable to leave message- sent mychart message. Pt added to recall list.

## 2022-03-29 ENCOUNTER — Encounter: Payer: Self-pay | Admitting: Obstetrics and Gynecology

## 2023-10-16 ENCOUNTER — Other Ambulatory Visit (HOSPITAL_COMMUNITY)
Admission: RE | Admit: 2023-10-16 | Discharge: 2023-10-16 | Disposition: A | Discharge status: Home / Self Care | Source: Ambulatory Visit | Attending: Obstetrics & Gynecology | Admitting: Obstetrics & Gynecology

## 2023-10-16 ENCOUNTER — Encounter: Payer: Self-pay | Admitting: Obstetrics & Gynecology

## 2023-10-16 ENCOUNTER — Ambulatory Visit: Payer: 59 | Admitting: Obstetrics & Gynecology

## 2023-10-16 VITALS — BP 125/82 | HR 86 | Ht 62.0 in | Wt 130.0 lb

## 2023-10-16 DIAGNOSIS — Z01419 Encounter for gynecological examination (general) (routine) without abnormal findings: Secondary | ICD-10-CM | POA: Diagnosis present

## 2023-10-16 MED ORDER — TERCONAZOLE 0.4 % VA CREA: 1.0000 | TOPICAL_CREAM | Freq: Every day | VAGINAL | 0 refills | Status: DC

## 2023-10-16 MED ORDER — NORETHINDRONE ACET-ETHINYL EST 1.5-30 MG-MCG PO TABS: 1.0000 | ORAL_TABLET | Freq: Every day | ORAL | 3 refills | Status: DC

## 2023-10-16 NOTE — Progress Notes (Signed)
 Subjective:     Jasmine Bailey is a 28 y.o. female here for a routine exam.  No LMP recorded. [id number] Birth Control Method:  COC Menstrual Calendar(currently): regular  Current complaints: none.   Current acute medical issues:     Recent Gynecologic History No LMP recorded. Last Pap: 2022,  normal Last mammogram: na,    Past Medical History:  Diagnosis Date   Abortion    Medical history non-contributory     Past Surgical History:  Procedure Laterality Date   NO PAST SURGERIES      OB History     Gravida  4   Para  2   Term  2   Preterm      AB  2   Living  2      SAB      IAB  2   Ectopic      Multiple  0   Live Births  2           Social History   Socioeconomic History   Marital status: Single    Spouse name: Not on file   Number of children: 1   Years of education: Not on file   Highest education level: High school graduate  Occupational History   Not on file  Tobacco Use   Smoking status: Some Days    Current packs/day: 0.00    Types: Cigarettes    Last attempt to quit: 08/28/2007    Years since quitting: 16.1   Smokeless tobacco: Never  Vaping Use   Vaping status: Never Used  Substance and Sexual Activity   Alcohol use: Not Currently    Comment: occasionally    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    Sexual activity: Yes    Birth control/protection: Pill  Other Topics Concern   Not on file  Social History Narrative   Not on file   Social Drivers of Health   Financial Resource Strain: Low Risk  (11/17/2019)   Overall Financial Resource Strain (CARDIA)    Difficulty of Paying Living Expenses: Not hard at all  Food Insecurity: No Food Insecurity (11/17/2019)   Hunger Vital Sign    Worried About Running Out of Food in the Last Year: Never true    Ran Out of Food in the Last Year: Never true  Transportation Needs: No Transportation Needs (11/17/2019)   PRAPARE - Scientist, research (physical sciences) (Medical): No    Lack of Transportation (Non-Medical): No  Physical Activity: Sufficiently Active (11/17/2019)   Exercise Vital Sign    Days of Exercise per Week: 3 days    Minutes of Exercise per Session: 150+ min  Stress: Stress Concern Present (11/17/2019)   [employer] of Occupational Health - Occupational Stress Questionnaire    Feeling of Stress : Very much  Social Connections: Moderately Isolated (11/17/2019)   Social Connection and Isolation Panel [NHANES]    Frequency of Communication with Friends and Family: More than three times a week    Frequency of Social Gatherings with Friends and Family: More than three times a week    Attends Religious Services: 1 to 4 times per year    Active Member of [employer] or Organizations: No    Attends Banker Meetings: Never    Marital Status: Never married    Family History  Problem Relation Age of Onset   Hypertension Other    Diabetes Other  Congestive Heart Failure Maternal Grandfather    Hypertension Father      Current Outpatient Medications:    cetirizine (ZYRTEC) 10 MG tablet, Take 10 mg by mouth daily., Disp: , Rfl:    terconazole (TERAZOL 7) 0.4 % vaginal cream, Place 1 applicator vaginally at bedtime., Disp: 45 g, Rfl: 0   Norethindrone Acetate-Ethinyl Estradiol (HAILEY 1.5/30) 1.5-30 MG-MCG tablet, Take 1 tablet by mouth daily., Disp: 84 tablet, Rfl: 3  Review of Systems  Review of Systems  Constitutional: Negative for fever, chills, weight loss, malaise/fatigue and diaphoresis.  HENT: Negative for hearing loss, ear pain, nosebleeds, congestion, sore throat, neck pain, tinnitus and ear discharge.   Eyes: Negative for blurred vision, double vision, photophobia, pain, discharge and redness.  Respiratory: Negative for cough, hemoptysis, sputum production, shortness of breath, wheezing and stridor.   Cardiovascular: Negative for chest pain, palpitations, orthopnea, claudication, leg swelling and  PND.  Gastrointestinal: negative for abdominal pain. Negative for heartburn, nausea, vomiting, diarrhea, constipation, blood in stool and melena.  Genitourinary: Negative for dysuria, urgency, frequency, hematuria and flank pain.  Musculoskeletal: Negative for myalgias, back pain, joint pain and falls.  Skin: Negative for itching and rash.  Neurological: Negative for dizziness, tingling, tremors, sensory change, speech change, focal weakness, seizures, loss of consciousness, weakness and headaches.  Endo/Heme/Allergies: Negative for environmental allergies and polydipsia. Does not bruise/bleed easily.  Psychiatric/Behavioral: Negative for depression, suicidal ideas, hallucinations, memory loss and substance abuse. The patient is not nervous/anxious and does not have insomnia.        Objective:  Blood pressure 125/82, pulse 86, height 5\' 2"  (1.575 m), weight 130 lb (59 kg).   Physical Exam  Vitals reviewed. Constitutional: She is oriented to person, place, and time. She appears well-developed and well-nourished.  HENT:  Head: Normocephalic and atraumatic.        Right Ear: External ear normal.  Left Ear: External ear normal.  Nose: Nose normal.  Mouth/Throat: Oropharynx is clear and moist.  Eyes: Conjunctivae and EOM are normal. Pupils are equal, round, and reactive to light. Right eye exhibits no discharge. Left eye exhibits no discharge. No scleral icterus.  Neck: Normal range of motion. Neck supple. No tracheal deviation present. No thyromegaly present.  Cardiovascular: Normal rate, regular rhythm, normal heart sounds and intact distal pulses.  Exam reveals no gallop and no friction rub.   No murmur heard. Respiratory: Effort normal and breath sounds normal. No respiratory distress. She has no wheezes. She has no rales. She exhibits no tenderness.  GI: Soft. Bowel sounds are normal. She exhibits no distension and no mass. There is no tenderness. There is no rebound and no guarding.   Genitourinary:  Breasts no masses skin changes or nipple changes bilaterally      Vulva is normal without lesions Vagina is pink moist + yeast Cervix normal in appearance and pap is done Uterus is normal size shape and contour Adnexa is negative with normal sized ovaries   Musculoskeletal: Normal range of motion. She exhibits no edema and no tenderness.  Neurological: She is alert and oriented to person, place, and time. She has normal reflexes. She displays normal reflexes. No cranial nerve deficit. She exhibits normal muscle tone. Coordination normal.  Skin: Skin is warm and dry. No rash noted. No erythema. No pallor.  Psychiatric: She has a normal mood and affect. Her behavior is normal. Judgment and thought content normal.       Medications Ordered at today's visit: Meds ordered this encounter  Medications   terconazole (TERAZOL 7) 0.4 % vaginal cream    Sig: Place 1 applicator vaginally at bedtime.    Dispense:  45 g    Refill:  0   Norethindrone Acetate-Ethinyl Estradiol (HAILEY 1.5/30) 1.5-30 MG-MCG tablet    Sig: Take 1 tablet by mouth daily.    Dispense:  84 tablet    Refill:  3    Other orders placed at today's visit: No orders of the defined types were placed in this encounter.    ASSESSMENT + PLAN:    ICD-10-CM   1. Well woman exam with routine gynecological exam  Z01.419     2. Encounter for gynecological examination with Papanicolaou smear of cervix  Z01.419 Cytology - PAP( [facility])          No follow-ups on file.

## 2023-10-19 LAB — CYTOLOGY - PAP
Chlamydia: NEGATIVE
Comment: NEGATIVE
Comment: NEGATIVE
Comment: NEGATIVE
Comment: NEGATIVE
Comment: NORMAL
Diagnosis: UNDETERMINED — AB
HPV 16: NEGATIVE
HPV 18 / 45: NEGATIVE
High risk HPV: POSITIVE — AB
Neisseria Gonorrhea: NEGATIVE

## 2023-10-20 ENCOUNTER — Encounter: Payer: Self-pay | Admitting: Obstetrics & Gynecology

## 2023-11-05 ENCOUNTER — Other Ambulatory Visit (HOSPITAL_COMMUNITY)
Admission: RE | Admit: 2023-11-05 | Discharge: 2023-11-05 | Disposition: A | Discharge status: Home / Self Care | Source: Ambulatory Visit | Attending: Obstetrics & Gynecology | Admitting: Obstetrics & Gynecology

## 2023-11-05 ENCOUNTER — Encounter: Payer: Self-pay | Admitting: Obstetrics & Gynecology

## 2023-11-05 ENCOUNTER — Ambulatory Visit: Admitting: Obstetrics & Gynecology

## 2023-11-05 VITALS — BP 115/77 | HR 65 | Ht 62.0 in | Wt 128.6 lb

## 2023-11-05 DIAGNOSIS — R87610 Atypical squamous cells of undetermined significance on cytologic smear of cervix (ASC-US): Secondary | ICD-10-CM | POA: Insufficient documentation

## 2023-11-05 DIAGNOSIS — R87810 Cervical high risk human papillomavirus (HPV) DNA test positive: Secondary | ICD-10-CM | POA: Diagnosis not present

## 2023-11-05 DIAGNOSIS — Z3202 Encounter for pregnancy test, result negative: Secondary | ICD-10-CM | POA: Diagnosis not present

## 2023-11-05 LAB — POCT URINE PREGNANCY: Preg Test, Ur: NEGATIVE

## 2023-11-05 NOTE — Addendum Note (Signed)
 Addended by: Moss Mc on: 11/05/2023 11:47 AM   Modules accepted: Orders

## 2023-11-05 NOTE — Addendum Note (Signed)
 Addended by: Moss Mc on: 11/05/2023 11:45 AM   Modules accepted: Orders

## 2023-11-05 NOTE — Progress Notes (Signed)
    Patient name: Jasmine Bailey MRN [id number]  Date of birth: 17-Mar-1996 Chief Complaint:   Colposcopy  History of Present Illness:   Jasmine Bailey is a 28 y.o. [id number]  female being seen today for cervical dysplasia management.  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  Doing better now- denies irregular discharge, itching or irritation.  Denies pelvic or abdominal pain.  Patient's last menstrual period was 10/10/2023.     11/17/2019    2:06 PM 11/06/2019    2:56 PM 11/26/2018   11:11 AM 06/25/2018   10:22 AM 10/11/2016   12:11 PM  Depression screen PHQ 2/9  Decreased Interest 1 0 0 3 0  Down, Depressed, Hopeless 2 0 0 0 0  PHQ - 2 Score 3 0 0 3 0  Altered sleeping 0  2 0   Tired, decreased energy 1  0 0   Change in appetite 1  0 0   Feeling bad or failure about yourself  1  0 0   Trouble concentrating 0  0 0   Moving slowly or fidgety/restless 1  0 0   Suicidal thoughts 0  0 0   PHQ-9 Score 7  2 3    Difficult doing work/chores    Not difficult at all      Review of Systems:   Pertinent items are noted in HPI Denies fever/chills, dizziness, headaches, visual disturbances, fatigue, shortness of breath, chest pain, abdominal pain, vomiting, no problems with periods, bowel movements, urination, or intercourse unless otherwise stated above.  Pertinent History Reviewed:  Reviewed past medical,surgical, social, obstetrical and family history.  Reviewed problem list, medications and allergies. Physical Assessment:   Vitals:   11/05/23 1028  BP: 115/77  Pulse: 65  Weight: 128 lb 9.6 oz (58.3 kg)  Height: 5\' 2"  (1.575 m)  Body mass index is 23.52 kg/m.       Physical Examination:   General appearance: alert, well appearing, and in no distress  Psych: mood appropriate, normal  affect  Skin: warm & dry   Cardiovascular: normal heart rate noted  Respiratory: normal respiratory effort, no distress  Abdomen: soft, non-tender   Pelvic: normal external genitalia, vulva, vagina, cervix, uterus and adnexa  Extremities: no edema   Chaperone: Jobe Marker     Colposcopy Procedure Note  Indications: ASCUS, HPV+    Procedure Details  The risks and benefits of the procedure and Written informed consent obtained.  Speculum placed in vagina and excellent visualization of cervix achieved, cervix swabbed x 3 with acetic acid solution.  Findings: Adequate colposcopy is noted today.  TMZ zone present  Cervix: no visible lesions; Minimal acetowhite changes noted at 12 o'clock.  ECC and cervical biopsies obtained.    Monsel's applied.  Adequate hemostasis noted  Specimens: ECC and cervical biopsies  Complications: none.  Colposcopic Impression:   Plan(Based on 2019 ASCCP recommendations)  -Discussed HPV- reviewed incidence and its potential to cause condylomas to dysplasia to cervical cancer -Reviewed degree of abnormal pap smears  -Discussed ASCCP guidelines and current recommendations for colposcopy -As above, inform consent obtained and procedure completed -biopsies obtained, further management pending results -Questions and concerns were addressed  Myna Hidalgo, DO Attending Obstetrician & Gynecologist, [facility], [facility]

## 2023-11-06 ENCOUNTER — Encounter: Payer: Self-pay | Admitting: Obstetrics & Gynecology

## 2023-11-06 LAB — SURGICAL PATHOLOGY

## 2024-09-24 ENCOUNTER — Other Ambulatory Visit: Payer: Self-pay | Admitting: Obstetrics & Gynecology

## 2024-10-20 ENCOUNTER — Ambulatory Visit: Admitting: Obstetrics & Gynecology
